# Patient Record
Sex: Male | Born: 1980 | Hispanic: Yes | Marital: Married | Smoking: Never smoker
Health system: Southern US, Community
[De-identification: ages and names within clinical notes are randomized; demographics above are authoritative.]

## PROBLEM LIST (undated history)

## (undated) DIAGNOSIS — G8929 Other chronic pain: Secondary | ICD-10-CM

## (undated) DIAGNOSIS — I10 Essential (primary) hypertension: Secondary | ICD-10-CM

## (undated) DIAGNOSIS — K219 Gastro-esophageal reflux disease without esophagitis: Secondary | ICD-10-CM

## (undated) DIAGNOSIS — T7840XA Allergy, unspecified, initial encounter: Secondary | ICD-10-CM

## (undated) HISTORY — PX: COLONOSCOPY: SHX174

## (undated) HISTORY — DX: Gastro-esophageal reflux disease without esophagitis: K21.9

## (undated) HISTORY — DX: Other chronic pain: G89.29

## (undated) HISTORY — DX: Allergy, unspecified, initial encounter: T78.40XA

---

## 2017-03-28 DIAGNOSIS — K219 Gastro-esophageal reflux disease without esophagitis: Secondary | ICD-10-CM | POA: Insufficient documentation

## 2017-09-03 DIAGNOSIS — R1084 Generalized abdominal pain: Secondary | ICD-10-CM | POA: Insufficient documentation

## 2017-09-03 DIAGNOSIS — R7989 Other specified abnormal findings of blood chemistry: Secondary | ICD-10-CM | POA: Insufficient documentation

## 2017-09-03 DIAGNOSIS — K529 Noninfective gastroenteritis and colitis, unspecified: Secondary | ICD-10-CM | POA: Insufficient documentation

## 2017-09-03 DIAGNOSIS — R5383 Other fatigue: Secondary | ICD-10-CM | POA: Insufficient documentation

## 2017-10-12 DIAGNOSIS — M545 Low back pain, unspecified: Secondary | ICD-10-CM | POA: Insufficient documentation

## 2017-10-12 DIAGNOSIS — I10 Essential (primary) hypertension: Secondary | ICD-10-CM | POA: Insufficient documentation

## 2017-10-12 DIAGNOSIS — B079 Viral wart, unspecified: Secondary | ICD-10-CM | POA: Insufficient documentation

## 2020-02-26 ENCOUNTER — Encounter (HOSPITAL_COMMUNITY): Payer: Self-pay | Admitting: Emergency Medicine

## 2020-02-26 ENCOUNTER — Observation Stay (HOSPITAL_COMMUNITY)
Admission: EM | Admit: 2020-02-26 | Discharge: 2020-02-28 | DRG: 641 | Disposition: A | Discharge status: Home / Self Care | Payer: 59 | Attending: Internal Medicine | Admitting: Internal Medicine

## 2020-02-26 ENCOUNTER — Other Ambulatory Visit: Payer: Self-pay

## 2020-02-26 DIAGNOSIS — R29898 Other symptoms and signs involving the musculoskeletal system: Secondary | ICD-10-CM

## 2020-02-26 DIAGNOSIS — G8929 Other chronic pain: Secondary | ICD-10-CM | POA: Diagnosis not present

## 2020-02-26 DIAGNOSIS — Z8249 Family history of ischemic heart disease and other diseases of the circulatory system: Secondary | ICD-10-CM

## 2020-02-26 DIAGNOSIS — D72829 Elevated white blood cell count, unspecified: Secondary | ICD-10-CM | POA: Diagnosis present

## 2020-02-26 DIAGNOSIS — R197 Diarrhea, unspecified: Secondary | ICD-10-CM | POA: Diagnosis not present

## 2020-02-26 DIAGNOSIS — R748 Abnormal levels of other serum enzymes: Secondary | ICD-10-CM | POA: Diagnosis not present

## 2020-02-26 DIAGNOSIS — E669 Obesity, unspecified: Secondary | ICD-10-CM | POA: Diagnosis not present

## 2020-02-26 DIAGNOSIS — E876 Hypokalemia: Principal | ICD-10-CM

## 2020-02-26 DIAGNOSIS — R109 Unspecified abdominal pain: Secondary | ICD-10-CM | POA: Diagnosis present

## 2020-02-26 DIAGNOSIS — Z20822 Contact with and (suspected) exposure to covid-19: Secondary | ICD-10-CM | POA: Diagnosis not present

## 2020-02-26 DIAGNOSIS — R262 Difficulty in walking, not elsewhere classified: Secondary | ICD-10-CM | POA: Diagnosis not present

## 2020-02-26 DIAGNOSIS — Z6832 Body mass index (BMI) 32.0-32.9, adult: Secondary | ICD-10-CM | POA: Diagnosis not present

## 2020-02-26 DIAGNOSIS — E860 Dehydration: Secondary | ICD-10-CM | POA: Diagnosis not present

## 2020-02-26 DIAGNOSIS — I10 Essential (primary) hypertension: Secondary | ICD-10-CM | POA: Diagnosis present

## 2020-02-26 DIAGNOSIS — M549 Dorsalgia, unspecified: Secondary | ICD-10-CM | POA: Diagnosis present

## 2020-02-26 DIAGNOSIS — M6281 Muscle weakness (generalized): Secondary | ICD-10-CM | POA: Diagnosis present

## 2020-02-26 NOTE — ED Triage Notes (Signed)
Patient reports bilateral legs weakness/numbness onset 5am this morning , denies injury , alert and oriented /respirations unlabored , patient stated that he was injected with vitamin yesterday prior to start of symptoms . Speech clear with no facial asymmetry , no arm drift .

## 2020-02-27 ENCOUNTER — Observation Stay (HOSPITAL_COMMUNITY): Payer: 59

## 2020-02-27 DIAGNOSIS — M6281 Muscle weakness (generalized): Secondary | ICD-10-CM | POA: Diagnosis present

## 2020-02-27 DIAGNOSIS — E876 Hypokalemia: Principal | ICD-10-CM

## 2020-02-27 DIAGNOSIS — E860 Dehydration: Secondary | ICD-10-CM

## 2020-02-27 DIAGNOSIS — I10 Essential (primary) hypertension: Secondary | ICD-10-CM

## 2020-02-27 DIAGNOSIS — E669 Obesity, unspecified: Secondary | ICD-10-CM

## 2020-02-27 LAB — URINALYSIS, ROUTINE W REFLEX MICROSCOPIC
Bilirubin Urine: NEGATIVE
Glucose, UA: NEGATIVE mg/dL
Hgb urine dipstick: NEGATIVE
Ketones, ur: NEGATIVE mg/dL
Leukocytes,Ua: NEGATIVE
Nitrite: NEGATIVE
Protein, ur: NEGATIVE mg/dL
Specific Gravity, Urine: 1.023 (ref 1.005–1.030)
pH: 6 (ref 5.0–8.0)

## 2020-02-27 LAB — CBC
HCT: 43.9 % (ref 39.0–52.0)
Hemoglobin: 15.7 g/dL (ref 13.0–17.0)
MCH: 31.2 pg (ref 26.0–34.0)
MCHC: 35.8 g/dL (ref 30.0–36.0)
MCV: 87.1 fL (ref 80.0–100.0)
Platelets: 215 10*3/uL (ref 150–400)
RBC: 5.04 MIL/uL (ref 4.22–5.81)
RDW: 12.9 % (ref 11.5–15.5)
WBC: 14.1 10*3/uL — ABNORMAL HIGH (ref 4.0–10.5)
nRBC: 0 % (ref 0.0–0.2)

## 2020-02-27 LAB — BASIC METABOLIC PANEL
Anion gap: 4 — ABNORMAL LOW (ref 5–15)
BUN: 19 mg/dL (ref 6–20)
CO2: 22 mmol/L (ref 22–32)
Calcium: 7.9 mg/dL — ABNORMAL LOW (ref 8.9–10.3)
Chloride: 115 mmol/L — ABNORMAL HIGH (ref 98–111)
Creatinine, Ser: 0.98 mg/dL (ref 0.61–1.24)
GFR calc Af Amer: >60 mL/min (ref >60)
GFR calc non Af Amer: >60 mL/min (ref >60)
Glucose, Bld: 119 mg/dL — ABNORMAL HIGH (ref 70–99)
Potassium: 4.5 mmol/L (ref 3.5–5.1)
Sodium: 141 mmol/L (ref 135–145)

## 2020-02-27 LAB — CBC WITH DIFFERENTIAL/PLATELET
Abs Immature Granulocytes: 0.12 10*3/uL — ABNORMAL HIGH (ref 0.00–0.07)
Basophils Absolute: 0.1 10*3/uL (ref 0.0–0.1)
Basophils Relative: 0 %
Eosinophils Absolute: 0.1 10*3/uL (ref 0.0–0.5)
Eosinophils Relative: 0 %
HCT: 49.6 % (ref 39.0–52.0)
Hemoglobin: 17.5 g/dL — ABNORMAL HIGH (ref 13.0–17.0)
Immature Granulocytes: 1 %
Lymphocytes Relative: 16 %
Lymphs Abs: 3.5 10*3/uL (ref 0.7–4.0)
MCH: 31.3 pg (ref 26.0–34.0)
MCHC: 35.3 g/dL (ref 30.0–36.0)
MCV: 88.6 fL (ref 80.0–100.0)
Monocytes Absolute: 1.1 10*3/uL — ABNORMAL HIGH (ref 0.1–1.0)
Monocytes Relative: 5 %
Neutro Abs: 16.6 10*3/uL — ABNORMAL HIGH (ref 1.7–7.7)
Neutrophils Relative %: 78 %
Platelets: 267 10*3/uL (ref 150–400)
RBC: 5.6 MIL/uL (ref 4.22–5.81)
RDW: 12.7 % (ref 11.5–15.5)
WBC: 21.5 10*3/uL — ABNORMAL HIGH (ref 4.0–10.5)
nRBC: 0 % (ref 0.0–0.2)

## 2020-02-27 LAB — MAGNESIUM: Magnesium: 1.9 mg/dL (ref 1.7–2.4)

## 2020-02-27 LAB — COMPREHENSIVE METABOLIC PANEL
ALT: 42 U/L (ref 0–44)
AST: 41 U/L (ref 15–41)
Albumin: 3.9 g/dL (ref 3.5–5.0)
Alkaline Phosphatase: 68 U/L (ref 38–126)
Anion gap: 9 (ref 5–15)
BUN: 21 mg/dL — ABNORMAL HIGH (ref 6–20)
CO2: 21 mmol/L — ABNORMAL LOW (ref 22–32)
Calcium: 9 mg/dL (ref 8.9–10.3)
Chloride: 110 mmol/L (ref 98–111)
Creatinine, Ser: 1.21 mg/dL (ref 0.61–1.24)
GFR calc Af Amer: >60 mL/min (ref >60)
GFR calc non Af Amer: >60 mL/min (ref >60)
Glucose, Bld: 152 mg/dL — ABNORMAL HIGH (ref 70–99)
Potassium: 2.5 mmol/L — CL (ref 3.5–5.1)
Sodium: 140 mmol/L (ref 135–145)
Total Bilirubin: 0.8 mg/dL (ref 0.3–1.2)
Total Protein: 7.3 g/dL (ref 6.5–8.1)

## 2020-02-27 LAB — CK
Total CK: 559 U/L — ABNORMAL HIGH (ref 49–397)
Total CK: 831 U/L — ABNORMAL HIGH (ref 49–397)

## 2020-02-27 LAB — TSH: TSH: 4.608 u[IU]/mL — ABNORMAL HIGH (ref 0.350–4.500)

## 2020-02-27 LAB — SARS CORONAVIRUS 2 (TAT 6-24 HRS): SARS Coronavirus 2: NEGATIVE

## 2020-02-27 LAB — PHOSPHORUS: Phosphorus: 1.9 mg/dL — ABNORMAL LOW (ref 2.5–4.6)

## 2020-02-27 LAB — HIV ANTIBODY (ROUTINE TESTING W REFLEX): HIV Screen 4th Generation wRfx: NONREACTIVE

## 2020-02-27 MED ORDER — POTASSIUM CHLORIDE CRYS ER 20 MEQ PO TBCR
40.0000 meq | EXTENDED_RELEASE_TABLET | Freq: Once | ORAL | Status: AC
  Administered 2020-02-27: 40 meq via ORAL
  Filled 2020-02-27: qty 2

## 2020-02-27 MED ORDER — POTASSIUM CHLORIDE 10 MEQ/100ML IV SOLN
10.0000 meq | INTRAVENOUS | Status: AC
  Administered 2020-02-27 (×3): 10 meq via INTRAVENOUS
  Filled 2020-02-27 (×3): qty 100

## 2020-02-27 MED ORDER — SODIUM CHLORIDE 0.9 % IV SOLN: INTRAVENOUS | Status: DC

## 2020-02-27 MED ORDER — ENOXAPARIN SODIUM 40 MG/0.4ML ~~LOC~~ SOLN
40.0000 mg | SUBCUTANEOUS | Status: DC
  Administered 2020-02-27 – 2020-02-28 (×2): 40 mg via SUBCUTANEOUS
  Filled 2020-02-27 (×2): qty 0.4

## 2020-02-27 NOTE — ED Notes (Signed)
Potassium 2.5, MD notified

## 2020-02-27 NOTE — ED Notes (Signed)
Pt ambulating with PT

## 2020-02-27 NOTE — ED Notes (Signed)
Pt remains in MRI, family member at bedside.

## 2020-02-27 NOTE — ED Notes (Signed)
Transported to MRI

## 2020-02-27 NOTE — Progress Notes (Addendum)
PROGRESS NOTE    William Salinas  ZSW:[id number] DOB: 19-Sep-1981 DOA: 02/26/2020 PCP: Kirstie Peri, MD   Brief Narrative:  William Salinas is a 39 y.o. male with PMH of HTN (untreated) and chronic back pain who presented to ED with muscle weakness and admitted for further workup. Found to have hypokalemia.   History provided by patient and his wife who is at bedside. Encounter conducted in Spanish by provider. Patient reports feeling well and in his usual stated of health until yesterday morning when he woke up and was unable to walk and actually fell trying to walk to the bathroom. He did not hit his head and no LOC. Reports that he felt whole body weakness and specifically in his proximal extremities. Denies anything like this happening previously. On Friday, he did receive one B12 injection into his right buttocks and reports feeling okay that night. On Saturday he did have a mild headache but nothing unlike previous headaches he has had. At baseline he as intermittent abdominal pain with occasional diarrhea and noted some diarrhea yesterday. Denies any other concerns. His chronic neck and back pain has not worsened recently. Denies trauma, fever, incontinence, unexplained weight loss, a cancer history, long-term steroid use, parenteral drug abuse, and intense localized pain. Denies dizziness, vision changes, chills, cough, SOB, chest pain, abdominal pain beyond baseline, nausea, vomiting, constipation, dysuria, hematuria, hematochezia, melena, speech difficulty, trouble eating, confusion or any other complaints. During time in ER, muscle tone already improving. Patient reports initially having trouble lifting his arms above his head and now able to do this. His wife reports that yesterday his extremities felt cold and appeared more white in color.   In the ED: Vitals stable. Labs remarkable for: Hgb 17.5, WBC 21.5, K 2.5, Mag 1.9, UA WNL, CK 559.  MRI Thoracic and Lumbar  Spine ordered and pending completion.  Patient was given potassium replacement and called for admission for further workup of acute weakness.   Assessment & Plan:   Principal Problem:   Muscle weakness (generalized) Active Problems:   Hypokalemia   HTN (hypertension)   Obesity (BMI 30-39.9)   Muscle weakness. MRI of thoracic and lumbar spine are negative for acute process CK elevated-we will add IV normal saline Recheck a CK twice daily Recheck renal function in the morning  Hypokalemia Repleted aggressively now normal at 4.5 Magnesium normal 1.9  Dehydration We will add IV hydration normal saline 100 mL/h Monitor ins and outs Recheck renal function in the morning  Leukocytosis Unclear etiology no evidence of infection Likely stress demargination recheck CBC in the morning  Hypertension: Patient with elevated blood pressure on admission,  Ranging from from 120s to 140s,  initially elevated Monitor for the next 24 hours Patient may benefit from antihypertensives on discharge  DVT prophylaxis: Lovenox SQ  Code Status: Full code    Code Status Orders  (From admission, onward)         Start     Ordered   02/27/20 0718  Full code  Continuous     02/27/20 0717        Code Status History    This patient has a current code status but no historical code status.   Advance Care Planning Activity     Family Communication: Discussed with wife at bedside Disposition Plan:   Patient be changed to inpatient as he requires IV fluid resuscitation, monitoring of renal function in setting of elevated CK and possible rhabdo. Consults called: None Admission status:  Inpatient   Consultants:   None  Procedures:  MR THORACIC SPINE WO CONTRAST  Result Date: 02/27/2020 CLINICAL DATA:  Low back pain, greater than 4 weeks or red flag, no prior imaging. EXAM: MRI THORACIC SPINE WITHOUT CONTRAST TECHNIQUE: Multiplanar, multisequence MR imaging of the thoracic spine was  performed. No intravenous contrast was administered. COMPARISON:  No pertinent prior studies available for comparison. FINDINGS: Alignment:  Normal Vertebrae: Vertebral body height is maintained. No marrow edema or suspicious osseous lesion. Cord:  No spinal cord signal abnormality. Paraspinal and other soft tissues: No abnormality identified within included portions of the thorax or upper abdomen. Paraspinal soft tissues within normal limits. Disc levels: Intervertebral disc height is preserved. There is no significant disc herniation, spinal canal stenosis or neural foraminal narrowing at any level. IMPRESSION: Unremarkable MRI of the thoracic spine. No significant disc herniation, spinal canal stenosis or neural foraminal narrowing at any level. Electronically Signed   By: Kellie Simmering DO   On: 02/27/2020 08:34   MR LUMBAR SPINE WO CONTRAST  Result Date: 02/27/2020 CLINICAL DATA:  Low back pain, greater than 4 weeks or red flag, no prior imaging. Additional history obtained from William NUMBERBilateral lower extremity weakness EXAM: MRI LUMBAR SPINE WITHOUT CONTRAST TECHNIQUE: Multiplanar, multisequence MR imaging of the lumbar spine was performed. No intravenous contrast was administered. COMPARISON:  No pertinent prior studies available for comparison. FINDINGS: Segmentation: For the purposes of this dictation, five lumbar vertebrae are assumed and the caudal most well-formed intervertebral disc is designated L5-S1. Alignment:  Normal. Vertebrae: Vertebral body height is maintained. No marrow edema or suspicious osseous lesion. Conus medullaris and cauda equina: Conus extends to the T12-L1 level. No signal abnormality within the visualized distal spinal cord. Paraspinal and other soft tissues: No abnormality identified within included portions of the abdomen/retroperitoneum. Paraspinal soft tissues within normal limits. Disc levels: No significant disc herniation, spinal canal stenosis or  neural foraminal narrowing at the L1-L2, L2-L3, L3-L4 or L4-L5 levels. At L5-S1, there is mild disc degeneration. There is a small central disc protrusion at site of posterior annular fissure. No significant spinal canal stenosis or neural foraminal narrowing. IMPRESSION: At L5-S1, there is mild disc degeneration. There is also a small central disc protrusion at site of a posterior annular fissure. No significant spinal canal stenosis or neural foraminal narrowing at this level. No significant disc herniation, spinal canal stenosis or neural foraminal narrowing at the remaining levels. Electronically Signed   By: Kellie Simmering DO   On: 02/27/2020 08:37     Antimicrobials:   None    Subjective: Patient reports feeling weak although not as bad as on admission when he fell because of weakness  Objective: Vitals:   02/27/20 0545 02/27/20 0630 02/27/20 1108 02/27/20 1240  BP: 138/77 (!) 143/73  (!) 144/88  Pulse: 80 91 91 72  Resp:    16  Temp:    98.1 F (36.7 C)  TempSrc:    Oral  SpO2: 99% 98% 93% 99%  Weight:      Height:       No intake or output data in the 24 hours ending 02/27/20 1332 Filed Weights   02/26/20 2305  Weight: 90 kg    Examination:  General exam: Appears calm and comfortable  Respiratory system: Clear to auscultation. Respiratory effort normal. Cardiovascular system: S1 & S2 heard, RRR. No JVD, murmurs, rubs, gallops or clicks. No pedal edema. Gastrointestinal system: Abdomen is nondistended, soft and nontender. No  organomegaly or masses felt. Normal bowel sounds heard. Central nervous system: Alert and oriented. No gross focal neurological deficits.  Reported weakness with exam Extremities: Warm well perfused,. Skin: Mildly dry, mild skin tenting Psychiatry: Judgement and insight appear normal. Mood & affect appropriate.     Data Reviewed: I have personally reviewed following labs and imaging studies  CBC: Recent Labs  Lab 02/26/20 2317 02/27/20 0859   WBC 21.5* 14.1*  NEUTROABS 16.6*  --   HGB 17.5* 15.7  HCT 49.6 43.9  MCV 88.6 87.1  PLT 267 215   Basic Metabolic Panel: Recent Labs  Lab 02/27/20 0122 02/27/20 0249 02/27/20 0859  NA 140  --  141  K 2.5*  --  4.5  CL 110  --  115*  CO2 21*  --  22  GLUCOSE 152*  --  119*  BUN 21*  --  19  CREATININE 1.21  --  0.98  CALCIUM 9.0  --  7.9*  MG  --  1.9  --   PHOS  --   --  1.9*   GFR: Estimated Creatinine Clearance: 109.5 mL/min (by C-G formula based on SCr of 0.98 mg/dL). Liver Function Tests: Recent Labs  Lab 02/27/20 0122  AST 41  ALT 42  ALKPHOS 68  BILITOT 0.8  PROT 7.3  ALBUMIN 3.9   No results for input(s): LIPASE, AMYLASE in the last 168 hours. No results for input(s): AMMONIA in the last 168 hours. Coagulation Profile: No results for input(s): INR, PROTIME in the last 168 hours. Cardiac Enzymes: Recent Labs  Lab 02/27/20 0357  CKTOTAL 559*   BNP (last 3 results) No results for input(s): PROBNP in the last 8760 hours. HbA1C: No results for input(s): HGBA1C in the last 72 hours. CBG: No results for input(s): GLUCAP in the last 168 hours. Lipid Profile: No results for input(s): CHOL, HDL, LDLCALC, TRIG, CHOLHDL, LDLDIRECT in the last 72 hours. Thyroid Function Tests: Recent Labs    02/27/20 0859  TSH 4.608*   Anemia Panel: No results for input(s): VITAMINB12, FOLATE, FERRITIN, TIBC, IRON, RETICCTPCT in the last 72 hours. Sepsis Labs: No results for input(s): PROCALCITON, LATICACIDVEN in the last 168 hours.  Recent Results (from the past 240 hour(s))  SARS CORONAVIRUS 2 (TAT 6-24 HRS) Nasopharyngeal Nasopharyngeal Swab     Status: None   Collection Time: 02/27/20  5:19 AM   Specimen: Nasopharyngeal Swab  Result Value Ref Range Status   SARS Coronavirus 2 NEGATIVE NEGATIVE Final    Comment: (NOTE) SARS-CoV-2 target nucleic acids are NOT DETECTED. The SARS-CoV-2 RNA is generally detectable in upper and lower respiratory specimens during  the acute phase of infection. Negative results do not preclude SARS-CoV-2 infection, do not rule out co-infections with other pathogens, and should not be used as the sole basis for treatment or other patient management decisions. Negative results must be combined with clinical observations, patient history, and epidemiological information. The expected result is Negative. Fact Sheet for Patients: HairSlick.no Fact Sheet for Healthcare Providers: quierodirigir.com This test is not yet approved or cleared by the [place] FDA and  has been authorized for detection and/or diagnosis of SARS-CoV-2 by FDA under an Emergency Use Authorization (EUA). This EUA will remain  in effect (meaning this test can be used) for the duration of the COVID-19 declaration under Section 56 4(b)(1) of the Act, 21 U.S.C. section 360bbb-3(b)(1), unless the authorization is terminated or revoked sooner. Performed at [facility], [address] [place],  [place] [address]          Radiology Studies: MR THORACIC SPINE WO CONTRAST  Result Date: 02/27/2020 CLINICAL DATA:  Low back pain, greater than 4 weeks or red flag, no prior imaging. EXAM: MRI THORACIC SPINE WITHOUT CONTRAST TECHNIQUE: Multiplanar, multisequence MR imaging of the thoracic spine was performed. No intravenous contrast was administered. COMPARISON:  No pertinent prior studies available for comparison. FINDINGS: Alignment:  Normal Vertebrae: Vertebral body height is maintained. No marrow edema or suspicious osseous lesion. Cord:  No spinal cord signal abnormality. Paraspinal and other soft tissues: No abnormality identified within included portions of the thorax or upper abdomen. Paraspinal soft tissues within normal limits. Disc levels: Intervertebral disc height is preserved. There is no significant disc herniation, spinal canal stenosis or neural foraminal narrowing at any level.  IMPRESSION: Unremarkable MRI of the thoracic spine. No significant disc herniation, spinal canal stenosis or neural foraminal narrowing at any level. Electronically Signed   By: Jackey Loge DO   On: 02/27/2020 08:34   MR LUMBAR SPINE WO CONTRAST  Result Date: 02/27/2020 CLINICAL DATA:  Low back pain, greater than 4 weeks or red flag, no prior imaging. Additional history obtained from electronic MEDICAL RECORD NUMBERBilateral lower extremity weakness EXAM: MRI LUMBAR SPINE WITHOUT CONTRAST TECHNIQUE: Multiplanar, multisequence MR imaging of the lumbar spine was performed. No intravenous contrast was administered. COMPARISON:  No pertinent prior studies available for comparison. FINDINGS: Segmentation: For the purposes of this dictation, five lumbar vertebrae are assumed and the caudal most well-formed intervertebral disc is designated L5-S1. Alignment:  Normal. Vertebrae: Vertebral body height is maintained. No marrow edema or suspicious osseous lesion. Conus medullaris and cauda equina: Conus extends to the T12-L1 level. No signal abnormality within the visualized distal spinal cord. Paraspinal and other soft tissues: No abnormality identified within included portions of the abdomen/retroperitoneum. Paraspinal soft tissues within normal limits. Disc levels: No significant disc herniation, spinal canal stenosis or neural foraminal narrowing at the L1-L2, L2-L3, L3-L4 or L4-L5 levels. At L5-S1, there is mild disc degeneration. There is a small central disc protrusion at site of posterior annular fissure. No significant spinal canal stenosis or neural foraminal narrowing. IMPRESSION: At L5-S1, there is mild disc degeneration. There is also a small central disc protrusion at site of a posterior annular fissure. No significant spinal canal stenosis or neural foraminal narrowing at this level. No significant disc herniation, spinal canal stenosis or neural foraminal narrowing at the remaining levels. Electronically Signed    By: Jackey Loge DO   On: 02/27/2020 08:37        Scheduled Meds: . enoxaparin (LOVENOX) injection  40 mg Subcutaneous Q24H   Continuous Infusions: . sodium chloride       LOS: 0 days    Time spent: 35 minutes    Burke Keels, MD [facility]  If 7PM-7AM, please contact night-coverage  02/27/2020, 1:32 PM

## 2020-02-27 NOTE — Evaluation (Signed)
Physical Therapy Evaluation Patient Details Name: William Salinas MRN: [id number] DOB: 1981/08/22 Today's Date: 02/27/2020   History of Present Illness  William Salinas is a 39 y.o. male with PMH of HTN (untreated) and chronic back pain who presented to ED with muscle weakness and admitted for further workup. Found to have hypokalemia and possible rhabdomyolysis  Clinical Impression   Pt admitted with above diagnosis. Received pt supine on stretcher, agreeing to participate; Independent prior to admission; works as a Engineer, production to PT with notable LLE weakness compared to R, initial gait stiffness, chronic lumbar and upper thoracic pain;  Walked the hallways with stiff, guarded gait and one person guarding assist; Pt currently with functional limitations due to the deficits listed below (see PT Problem List). Pt will benefit from skilled PT to increase their independence and safety with mobility to allow discharge to the venue listed below.    Strongly recommend Outpt PT for help with his chronic back pain; tells [place] he is in between PCPs (his former PCP moved, and he has not established a new PCP yet); Can he get a referral for Outpt PT from here?  Recommend script to say something like, "Outpatient PT for Back Pain: Evaluate and Treat"     Follow Up Recommendations Outpatient PT(for Back Pain)    Equipment Recommendations  None recommended by PT    Recommendations for Other Services   TOC to help establish new PCP    Precautions / Restrictions Restrictions Weight Bearing Restrictions: No      Mobility  Bed Mobility Overal bed mobility: Independent                Transfers Overall transfer level: Needs assistance   Transfers: Sit to/from Stand Sit to Stand: Min guard(without physical contact)         General transfer comment: Minguard for safety; no overt difficulty  Ambulation/Gait Ambulation/Gait assistance: Min guard Gait  Distance (Feet): 120 Feet Assistive device: None Gait Pattern/deviations: Step-through pattern;Decreased step length - right;Decreased step length - left Gait velocity: quite slow   General Gait Details: Close minguard initially for safety (especially given falls at home); slow and stiff, guarded steps initially; Able to smooth out gait pattern and loosen amount of assist with cues, and more practice/time walking; William Salinas seemed to gain confidence by the end of the walk  Stairs            Wheelchair Mobility    Modified Rankin (Stroke Patients Only)       Balance Overall balance assessment: Mild deficits observed, not formally tested                                           Pertinent Vitals/Pain Pain Assessment: Faces Faces Pain Scale: Hurts little more Pain Location: upper thoracic pain and lumbar pain; pt reports is chronic; Pain extends from lumbar spine to lateral knee with slump test Pain Descriptors / Indicators: Aching;Grimacing Pain Intervention(s): Monitored during session    Home Living Family/patient expects to be discharged to:: Private residence Living Arrangements: Spouse/significant other;Children Available Help at Discharge: Family;Available PRN/intermittently Type of Home: Apartment Home Access: Stairs to enter Entrance Stairs-Rails: None Entrance Stairs-Number of Steps: 1-2 Home Layout: One level Home Equipment: Wheelchair - manual      Prior Function Level of Independence: Independent         Comments: Forklift  driver     Hand Dominance        Extremity/Trunk Assessment   Upper Extremity Assessment Upper Extremity Assessment: Overall WFL for tasks assessed(with noted grimace due to upper thoracic pain)    Lower Extremity Assessment Lower Extremity Assessment: RLE deficits/detail;LLE deficits/detail RLE Deficits / Details: Grossly 5/5 strength throughout; notable grimace with resistence applied in hip flexion LLE  Deficits / Details: Noting weakness in L quadriceps, L hip flexion, L hamstrings -- 4-4+/5; weaker than RLE; grimace and pain with ressitence to hip flexion    Cervical / Trunk Assessment Cervical / Trunk Assessment: Other exceptions Cervical / Trunk Exceptions: upper thoracic pain and lumbar pain; pt reports is chronic; Pain extends from lumbar spine to lateral knee with slump test  Communication   Communication: Prefers language other than English(Spanish)  Cognition Arousal/Alertness: Awake/alert Behavior During Therapy: WFL for tasks assessed/performed Overall Cognitive Status: Within Functional Limits for tasks assessed                                        General Comments General comments (skin integrity, edema, etc.): William Salinas, [place] Interpreter [phone]), facilitated communication    Exercises     Assessment/Plan    PT Assessment Patient needs continued PT services  PT Problem List Decreased strength;Decreased activity tolerance;Decreased mobility;Pain       PT Treatment Interventions DME instruction;Gait training;Stair training;Functional mobility training;Therapeutic activities;Therapeutic exercise;Balance training;Patient/family education    PT Goals (Current goals can be found in the Care Plan section)  Acute Rehab PT Goals Patient Stated Goal: Would like to be home for daughter's [place] PT Goal Formulation: With patient Time For Goal Achievement: 03/12/20 Potential to Achieve Goals: Good    Frequency Min 3X/week   Barriers to discharge        Co-evaluation               AM-PAC PT "6 Clicks" Mobility  Outcome Measure Help needed turning from your back to your side while in a flat bed without using bedrails?: None Help needed moving from lying on your back to sitting on the side of a flat bed without using bedrails?: None Help needed moving to and from a bed to a chair (including a wheelchair)?: None Help needed standing up from a  chair using your arms (e.g., wheelchair or bedside chair)?: None Help needed to walk in hospital room?: None Help needed climbing 3-5 steps with a railing? : A Little 6 Click Score: 23    End of Session Equipment Utilized During Treatment: Gait belt Activity Tolerance: Patient tolerated treatment well Patient left: in chair;with call bell/phone within reach;with family/visitor present Nurse Communication: Mobility status PT Visit Diagnosis: Other abnormalities of gait and mobility (R26.89);Pain Pain - Right/Left: (Central) Pain - part of body: (Lower back and upper thoracic )    Time: 3710-6269 PT Time Calculation (min) (ACUTE ONLY): 41 min   Charges:   PT Evaluation $PT Eval Low Complexity: 1 Low PT Treatments $Gait Training: 23-37 mins        Roney [place], [place]  Acute Rehabilitation Services Pager [phone] Office [phone]   [place]ta [place] 02/27/2020, 2:04 PM

## 2020-02-27 NOTE — ED Provider Notes (Signed)
[facility] EMERGENCY DEPARTMENT Provider Note   CSN: [id number] Arrival date & time: 02/26/20  2244  History Chief Complaint  Patient presents with   Bilateral Leg Weakness    William Salinas is a 39 y.o. male.  The history is provided by the patient. A language interpreter was used.  He has history of chronic back pain and had received an injection of some B vitamins yesterday.  This morning, he woke up with pain in his thighs and was unable to stand.  There was some mild nausea but no vomiting.  He denies abdominal pain or worsening of his back pain.  He has not had any urinary difficulty.  He denies numbness or tingling.  He is not taking any medication other than vitamins on an ongoing basis.  History reviewed. No pertinent past medical history.  There are no problems to display for this patient.   History reviewed. No pertinent surgical history.     No family history on file.  Social History   Tobacco Use   Smoking status: Never Smoker   Smokeless tobacco: Never Used  Substance Use Topics   Alcohol use: Never   Drug use: Never    Home Medications Prior to Admission medications   Not on File    Allergies    Patient has no known allergies.  Review of Systems   Review of Systems  All other systems reviewed and are negative.   Physical Exam Updated Vital Signs BP (!) 142/85    Pulse 65    Temp 98.3 F (36.8 C) (Oral)    Resp 16    Ht 5\' 6"  (1.676 m)    Wt 90 kg    SpO2 99%    BMI 32.02 kg/m   Physical Exam Vitals and nursing note reviewed.   39 year old male, resting comfortably and in no acute distress. Vital signs are significant for mildly elevated blood pressure. Oxygen saturation is 99%, which is normal. Head is normocephalic and atraumatic. PERRLA, EOMI. Oropharynx is clear. Neck is nontender and supple without adenopathy or JVD. Back is nontender and there is no CVA tenderness. Lungs are clear without rales,  wheezes, or rhonchi. Chest is nontender. Heart has regular rate and rhythm without murmur. Abdomen is soft, flat, nontender without masses or hepatosplenomegaly and peristalsis is normoactive. Extremities have no cyanosis or edema, full range of motion is present.  There is very mild tenderness to palpation over the quadriceps bilaterally. Skin is warm and dry without rash. Neurologic: Mental status is normal, cranial nerves are intact.  He has marked weakness of hip flexors bilaterally with strength 2/5.  There is normal strength of his quadriceps, ankle dorsiflexion and ankle plantarflexion  ED Results / Procedures / Treatments   Labs (all labs ordered are listed, but only abnormal results are displayed) Labs Reviewed  CBC WITH DIFFERENTIAL/PLATELET - Abnormal; Notable for the following components:      Result Value   WBC 21.5 (*)    Hemoglobin 17.5 (*)    Neutro Abs 16.6 (*)    Monocytes Absolute 1.1 (*)    Abs Immature Granulocytes 0.12 (*)    All other components within normal limits  COMPREHENSIVE METABOLIC PANEL - Abnormal; Notable for the following components:   Potassium 2.5 (*)    CO2 21 (*)    Glucose, Bld 152 (*)    BUN 21 (*)    All other components within normal limits  CK - Abnormal; Notable for  the following components:   Total CK 559 (*)    All other components within normal limits  SARS CORONAVIRUS 2 (TAT 6-24 HRS)  MAGNESIUM  URINALYSIS, ROUTINE W REFLEX MICROSCOPIC  BASIC METABOLIC PANEL  PHOSPHORUS  TSH  HIV ANTIBODY (ROUTINE TESTING W REFLEX)  CBC    Radiology No results found.  Procedures Procedures  CRITICAL CARE Performed by: Delora Fuel Total critical care time: 40 minutes Critical care time was exclusive of separately billable procedures and treating other patients. Critical care was necessary to treat or prevent imminent or life-threatening deterioration. Critical care was time spent personally by me on the following activities: development  of treatment plan with patient and/or surrogate as well as nursing, discussions with consultants, evaluation of patient's response to treatment, examination of patient, obtaining history from patient or surrogate, ordering and performing treatments and interventions, ordering and review of laboratory studies, ordering and review of radiographic studies, pulse oximetry and re-evaluation of patient's condition.  Medications Ordered in ED Medications  potassium chloride 10 mEq in 100 mL IVPB (10 mEq Intravenous New Bag/Given 02/27/20 0246)  potassium chloride SA (KLOR-CON) CR tablet 40 mEq (40 mEq Oral Given 02/27/20 0243)    ED Course  I have reviewed the triage vital signs and the nursing notes.  Pertinent labs & imaging results that were available during my care of the patient were reviewed by me and considered in my medical decision making (see chart for details).  MDM Rules/Calculators/A&P Leg pain and weakness concerning for possible rhabdomyolysis.  Will check CK.  Initial labs show severe hypokalemia with potassium 2.5.  Is given oral and intravenous potassium.  WBC is elevated, nonspecific.  Hemoglobin is elevated, possible hemoconcentration versus polycythemia.  He is given IV fluids.  He has no prior records in the [facility] health system.  CK is only mildly elevated, magnesium is normal.  Review of records on care everywhere shows hemoglobin is not significantly changed from prior.  He continues to have marked weakness of hip flexors we will plan to admit to continue potassium repletion.  MRI ordered of thoracic and lumbar spine.  Case is discussed with Dr. Marice Potter of [facility], who agrees to admit the patient.  Final Clinical Impression(s) / ED Diagnoses Final diagnoses:  Leg weakness, bilateral  Hypokalemia  Elevated CK    Rx / DC Orders ED Discharge Orders    None       Delora Fuel, MD 24/23/53 [phone]

## 2020-02-27 NOTE — H&P (Signed)
[facility] History and Physical  [name] LZJ:[id number] DOB: 02/14/1981 DOA: 02/26/2020  Referring ED physician: Preston Fleeting, MD PCP: Kirstie Peri, MD   Chief Complaint: Muscle Weakness  HPI: William Salinas is a 39 y.o. male with PMH of HTN (untreated) and chronic back pain who presented to ED with muscle weakness and admitted for further workup. Found to have hypokalemia.   History provided by patient and his wife who is at bedside. Encounter conducted in Spanish by provider. Patient reports feeling well and in his usual stated of health until yesterday morning when he woke up and was unable to walk and actually fell trying to walk to the bathroom. He did not hit his head and no LOC. Reports that he felt whole body weakness and specifically in his proximal extremities. Denies anything like this happening previously. On Friday, he did receive one B12 injection into his right buttocks and reports feeling okay that night. On Saturday he did have a mild headache but nothing unlike previous headaches he has had. At baseline he as intermittent abdominal pain with occasional diarrhea and noted some diarrhea yesterday. Denies any other concerns. His chronic neck and back pain has not worsened recently. Denies trauma, fever, incontinence, unexplained weight loss, a cancer history, long-term steroid use, parenteral drug abuse, and intense localized pain. Denies dizziness, vision changes, chills, cough, SOB, chest pain, abdominal pain beyond baseline, nausea, vomiting, constipation, dysuria, hematuria, hematochezia, melena, speech difficulty, trouble eating, confusion or any other complaints. During time in ER, muscle tone already improving. Patient reports initially having trouble lifting his arms above his head and now able to do this. His wife reports that yesterday his extremities felt cold and appeared more white in color.   In the ED: Vitals stable. Labs remarkable  for: Hgb 17.5, WBC 21.5, K 2.5, Mag 1.9, UA WNL, CK 559.  MRI Thoracic and Lumbar Spine ordered and pending completion.  Patient was given potassium replacement and called for admission for further workup of acute weakness.   Review of Systems:  All other systems negative unless noted above in HPI.   History reviewed. No pertinent past medical history. History reviewed. No pertinent surgical history. Social History:  reports that he has never smoked. He has never used smokeless tobacco. He reports that he does not drink alcohol or use drugs.  No Known Allergies  Family history of DM and HTN.  Prior to Admission medications   Medication Sig Start Date End Date Taking? Authorizing Provider  Multiple Vitamin (MULTIVITAMIN WITH MINERALS) TABS tablet Take 1 tablet by mouth daily.   Yes [provider]   Physical Exam: Vitals:   02/27/20 0245 02/27/20 0300 02/27/20 0400 02/27/20 0500  BP: (!) 153/93 (!) 142/85 (!) 128/99 137/72  Pulse: 83 65 69 72  Resp:      Temp:      TempSrc:      SpO2: 97% 99% 98% 98%  Weight:      Height:        Wt Readings from Last 3 Encounters:  02/26/20 90 kg    General:  Appears calm and comfortable. AAO x4. Eyes: EOMI, normal lids, irises & conjunctiva ENT: grossly normal hearing, lips & tongue Neck: no LAD, masses or thyromegaly Cardiovascular: RRR, no m/r/g. No LE edema. Respiratory: CTA bilaterally, no w/r/r. Normal respiratory effort. Abdomen: soft, ntnd Skin: no rash or induration seen on limited exam Musculoskeletal: Grossly normal muscle tone of upper and lower extremities with intact strength on my  exam. Limited ROM of hip flexion and extension. Sensation intact to light touch of upper and lower extremities. Normal warmth and color noted.  Psychiatric: grossly normal mood and affect, speech fluent and appropriate Neurologic: CN 2-12 intact.           Labs on Admission:  Basic Metabolic Panel: Recent Labs  Lab 02/27/20 0122  02/27/20 0249  NA 140  --   K 2.5*  --   CL 110  --   CO2 21*  --   GLUCOSE 152*  --   BUN 21*  --   CREATININE 1.21  --   CALCIUM 9.0  --   MG  --  1.9   Liver Function Tests: Recent Labs  Lab 02/27/20 0122  AST 41  ALT 42  ALKPHOS 68  BILITOT 0.8  PROT 7.3  ALBUMIN 3.9   No results for input(s): LIPASE, AMYLASE in the last 168 hours. No results for input(s): AMMONIA in the last 168 hours. CBC: Recent Labs  Lab 02/26/20 2317  WBC 21.5*  NEUTROABS 16.6*  HGB 17.5*  HCT 49.6  MCV 88.6  PLT 267   Cardiac Enzymes: Recent Labs  Lab 02/27/20 0357  CKTOTAL 559*    BNP (last 3 results) No results for input(s): BNP in the last 8760 hours.  ProBNP (last 3 results) No results for input(s): PROBNP in the last 8760 hours.  CBG: No results for input(s): GLUCAP in the last 168 hours.  Radiological Exams on Admission: No results found.  EKG: None  Assessment/Plan Principal Problem:   Muscle weakness (generalized) Active Problems:   Hypokalemia   HTN (hypertension)   Obesity (BMI 30-39.9)  39 y.o. male with PMH of HTN (untreated) and chronic back pain who presented to ED with muscle weakness and admitted for further workup. Found to have hypokalemia.   Muscle Weakness - Leading differential: B12 injection causing hypokalemia which thereby caused acute muscle weakness - Symptoms already improving by the time of my examination - CK WNL - MRI Thoracic and Lumbar Spine pending  - No recent injury of back and otherwise normal Neuro exam with exception of ROM of hips - PT consulted - If not improving, consider Neuro consult  - Denies trauma, fever, incontinence, unexplained weight loss, a cancer history, long-term steroid use, parenteral drug abuse, and intense localized back pain - WBC elevation non-specific; possibly acute reaction   Hypokalemia - K 2.5 on admission - s/p 70 mEq replacement in ED; repeat BMP pending - Mag level WNL  HTN - reports he was  diagnosed with this but never started taking medications as he switched doctors - BP's normal to mild-range; f/u outpatient   Code Status: Full Code DVT Prophylaxis: Lovenox Family Communication: Wife at bedside Disposition Plan: Admit under observation. Patient currently unable to walk. May need to be transferred to inpatient level should this not resolve over next 24 hours, but expect secondary to acute hypokalemia and discharge back home in next 24 hours.   Time spent: 55 minutes  [facility] Pager [phone]

## 2020-02-28 ENCOUNTER — Encounter (HOSPITAL_COMMUNITY): Payer: Self-pay | Admitting: Internal Medicine

## 2020-02-28 DIAGNOSIS — M6281 Muscle weakness (generalized): Secondary | ICD-10-CM | POA: Diagnosis not present

## 2020-02-28 LAB — CBC
HCT: 47.8 % (ref 39.0–52.0)
Hemoglobin: 16.1 g/dL (ref 13.0–17.0)
MCH: 30.8 pg (ref 26.0–34.0)
MCHC: 33.7 g/dL (ref 30.0–36.0)
MCV: 91.4 fL (ref 80.0–100.0)
Platelets: 208 10*3/uL (ref 150–400)
RBC: 5.23 MIL/uL (ref 4.22–5.81)
RDW: 13.3 % (ref 11.5–15.5)
WBC: 11.2 10*3/uL — ABNORMAL HIGH (ref 4.0–10.5)
nRBC: 0 % (ref 0.0–0.2)

## 2020-02-28 LAB — BASIC METABOLIC PANEL
Anion gap: 8 (ref 5–15)
BUN: 13 mg/dL (ref 6–20)
CO2: 23 mmol/L (ref 22–32)
Calcium: 8.7 mg/dL — ABNORMAL LOW (ref 8.9–10.3)
Chloride: 110 mmol/L (ref 98–111)
Creatinine, Ser: 1.01 mg/dL (ref 0.61–1.24)
GFR calc Af Amer: >60 mL/min (ref >60)
GFR calc non Af Amer: >60 mL/min (ref >60)
Glucose, Bld: 116 mg/dL — ABNORMAL HIGH (ref 70–99)
Potassium: 4.1 mmol/L (ref 3.5–5.1)
Sodium: 141 mmol/L (ref 135–145)

## 2020-02-28 LAB — CK: Total CK: 571 U/L — ABNORMAL HIGH (ref 49–397)

## 2020-02-28 MED ORDER — ACETAMINOPHEN 500 MG PO TABS: 500.0000 mg | ORAL_TABLET | Freq: Four times a day (QID) | ORAL | 0 refills | Status: AC | PRN

## 2020-02-28 MED ORDER — BLOOD PRESSURE KIT: 1.0000 | PACK | Freq: Two times a day (BID) | 0 refills | Status: AC

## 2020-02-28 MED ORDER — CYCLOBENZAPRINE HCL 10 MG PO TABS: 10.0000 mg | ORAL_TABLET | Freq: Three times a day (TID) | ORAL | 0 refills | Status: DC | PRN

## 2020-02-28 NOTE — Plan of Care (Signed)
  Problem: Education: Goal: Knowledge of General Education information will improve Description: Including pain rating scale, medication(s)/side effects and non-pharmacologic comfort measures Outcome: Progressing   Problem: Clinical Measurements: Goal: Respiratory complications will improve Outcome: Progressing Note: On room air   Problem: Activity: Goal: Risk for activity intolerance will decrease Outcome: Progressing Note: Up in room and hall way independently tolerating well, wife at bedside   Problem: Nutrition: Goal: Adequate nutrition will be maintained Outcome: Progressing   Problem: Coping: Goal: Level of anxiety will decrease Outcome: Progressing   Problem: Elimination: Goal: Will not experience complications related to urinary retention Outcome: Progressing   Problem: Pain Managment: Goal: General experience of comfort will improve Outcome: Progressing Note: No complaints of pain this shift   Problem: Safety: Goal: Ability to remain free from injury will improve Outcome: Progressing   Problem: Skin Integrity: Goal: Risk for impaired skin integrity will decrease Outcome: Progressing

## 2020-02-28 NOTE — Progress Notes (Signed)
Deryck Arguelles Edsel Petrin to be D/C'd home per MD order.  Discussed prescriptions and follow up appointments with the patient and wife. Prescriptions sent to patient's preferred pharmacy, medication list explained in detail. Pt verbalized understanding.  Allergies as of 02/28/2020   No Known Allergies     Medication List    TAKE these medications   acetaminophen 500 MG tablet Commonly known as: TYLENOL Take 1 tablet (500 mg total) by mouth every 6 (six) hours as needed.   Blood Pressure Kit 1 each by Does not apply route in the morning and at bedtime.   cyclobenzaprine 10 MG tablet Commonly known as: FLEXERIL Take 1 tablet (10 mg total) by mouth 3 (three) times daily as needed for muscle spasms.   multivitamin with minerals Tabs tablet Take 1 tablet by mouth daily.       Vitals:   02/27/20 2118 02/28/20 0844  BP: 137/74 (!) 153/85  Pulse: 69 79  Resp: 18 17  Temp: 98.1 F (36.7 C) 97.9 F (36.6 C)  SpO2: 97% 98%    Skin clean, dry and intact without evidence of skin break down, no evidence of skin tears noted. IV catheter discontinued intact. Site without signs and symptoms of complications. Dressing and pressure applied. Pt denies pain at this time. No complaints noted. Doctor's note given to patient for work.  An After Visit Summary was printed and given to the patient. Patient escorted via [place], and D/C home via private auto with wife.  [place] 02/28/2020 11:55 AM

## 2020-02-28 NOTE — Discharge Summary (Addendum)
Physician Discharge Summary  William Salinas ONG:[id number] DOB: 05-10-1981 DOA: 02/26/2020  PCP: Oran Rein, MD  Admit date: 02/26/2020 Discharge date: 02/28/2020  Admitted From: Home Disposition:  Home  Discharge Condition:Stable CODE STATUS:FULL Diet recommendation:  Regular   Brief/Interim Summary: HPI : William Arguelles Martinezis a 39 y.o.malewith PMH of HTN (untreated) and chronic back pain who presented to ED with muscle weakness and admitted for further workup. Found to have hypokalemia.   History provided by patient and his wife who is at bedside. Encounter conducted in Spanish by provider. Patient reports feeling well and in his usual stated of health until yesterday morning when he woke up and was unable to walk and actually fell trying to walk to the bathroom. He did not hit his head and no LOC. Reports that he felt whole body weakness and specifically in his proximal extremities. Denies anything like this happening previously. On Friday, he did receive one B12 injection into his right buttocks and reports feeling okay that night. On Saturday he did have a mild headache but nothing unlike previous headaches he has had. At baseline he as intermittent abdominal pain with occasional diarrhea and noted some diarrhea yesterday. Denies any other concerns. His chronic neck and back pain has not worsened recently. Deniestrauma, fever, incontinence, unexplained weight loss, a cancer history, long-term steroid use, parenteral drug abuse, and intense localized pain.Deniesdizziness, vision changes, chills, cough, SOB, chest pain, abdominal painbeyond baseline, nausea, vomiting, constipation, dysuria, hematuria, hematochezia, melena, speech difficulty, trouble eating, confusion or any other complaints.During time in ER, muscle tone already improving. Patient reports initially having trouble lifting his arms above his head and now able to do this. His wife reports that  yesterday his extremities felt cold and appeared more white in color.   In the ED: Vitals stable. Labs remarkable for: Hgb 17.5, WBC 21.5, K 2.5, Mag 1.9, UA WNL, CK 559.  MRI Thoracic and Lumbar Spine ordered and pending completion.  Patient was given potassium replacement and called for admission for further workup of acute weakness.  Hospital Course: His hospital course remained stable.  Blood pressure improved during the hospitalization.  He was treated with IV fluids.  Hypokalemia was treated with supplementation of potassium.  This morning he remains hemodynamically stable.  MRI did not show any significant abnormalities. He is stable for discharge to home today.  Physical therapy recommended outpatient PT.  Will recommend him to follow-up with the PCP in a week.  We will also recommend to follow-up with outpatient pain management clinic.  Following problems were addressed during his hospitalization:  Muscle weakness. MRI of thoracic and lumbar spine are negative for acute process CK mildly  elevated Kidney function normal  Hypokalemia Supplemented and corrected  Dehydration Treated with IV fluids  Leukocytosis Resolved  Hypertension: Patient with elevated blood pressure on admission,  Ranging from from 120s to 140s,  initially elevated This morning blood pressure was normal.  Will recommend to monitor blood pressure at home and follow-up with PCP.  Will not start on any antihypertensives for now.  Discharge Diagnoses:  Principal Problem:   Muscle weakness (generalized) Active Problems:   Hypokalemia   HTN (hypertension)   Obesity (BMI 30-39.9)   Dehydration    Discharge Instructions  Discharge Instructions    Diet general   Complete by: As directed    Discharge instructions   Complete by: As directed    1)Please follow up with your PCP in a week. 2)Monitor your blood pressure. 3)Follow up  with pain management clinic as an outpatient   Increase activity  slowly   Complete by: As directed      Allergies as of 02/28/2020   No Known Allergies     Medication List    TAKE these medications   acetaminophen 500 MG tablet Commonly known as: TYLENOL Take 1 tablet (500 mg total) by mouth every 6 (six) hours as needed.   Blood Pressure Kit 1 each by Does not apply route in the morning and at bedtime.   cyclobenzaprine 10 MG tablet Commonly known as: FLEXERIL Take 1 tablet (10 mg total) by mouth 3 (three) times daily as needed for muscle spasms.   multivitamin with minerals Tabs tablet Take 1 tablet by mouth daily.      Follow-up Information    Oran Rein, MD. Schedule an appointment as soon as possible for a visit in 1 week(s).   Specialty: Internal Medicine Contact information: [address] [place] [phone]          No Known Allergies  Consultations:  None   Procedures/Studies: MR THORACIC SPINE WO CONTRAST  Result Date: 02/27/2020 CLINICAL DATA:  Low back pain, greater than 4 weeks or red flag, no prior imaging. EXAM: MRI THORACIC SPINE WITHOUT CONTRAST TECHNIQUE: Multiplanar, multisequence MR imaging of the thoracic spine was performed. No intravenous contrast was administered. COMPARISON:  No pertinent prior studies available for comparison. FINDINGS: Alignment:  Normal Vertebrae: Vertebral body height is maintained. No marrow edema or suspicious osseous lesion. Cord:  No spinal cord signal abnormality. Paraspinal and other soft tissues: No abnormality identified within included portions of the thorax or upper abdomen. Paraspinal soft tissues within normal limits. Disc levels: Intervertebral disc height is preserved. There is no significant disc herniation, spinal canal stenosis or neural foraminal narrowing at any level. IMPRESSION: Unremarkable MRI of the thoracic spine. No significant disc herniation, spinal canal stenosis or neural foraminal narrowing at any level. Electronically Signed   By:  Kellie Simmering DO   On: 02/27/2020 08:34   MR LUMBAR SPINE WO CONTRAST  Result Date: 02/27/2020 CLINICAL DATA:  Low back pain, greater than 4 weeks or red flag, no prior imaging. Additional history obtained from [place] NUMBERBilateral lower extremity weakness EXAM: MRI LUMBAR SPINE WITHOUT CONTRAST TECHNIQUE: Multiplanar, multisequence MR imaging of the lumbar spine was performed. No intravenous contrast was administered. COMPARISON:  No pertinent prior studies available for comparison. FINDINGS: Segmentation: For the purposes of this dictation, five lumbar vertebrae are assumed and the caudal most well-formed intervertebral disc is designated L5-S1. Alignment:  Normal. Vertebrae: Vertebral body height is maintained. No marrow edema or suspicious osseous lesion. Conus medullaris and cauda equina: Conus extends to the T12-L1 level. No signal abnormality within the visualized distal spinal cord. Paraspinal and other soft tissues: No abnormality identified within included portions of the abdomen/retroperitoneum. Paraspinal soft tissues within normal limits. Disc levels: No significant disc herniation, spinal canal stenosis or neural foraminal narrowing at the L1-L2, L2-L3, L3-L4 or L4-L5 levels. At L5-S1, there is mild disc degeneration. There is a small central disc protrusion at site of posterior annular fissure. No significant spinal canal stenosis or neural foraminal narrowing. IMPRESSION: At L5-S1, there is mild disc degeneration. There is also a small central disc protrusion at site of a posterior annular fissure. No significant spinal canal stenosis or neural foraminal narrowing at this level. No significant disc herniation, spinal canal stenosis or neural foraminal narrowing at the remaining levels. Electronically Signed  By: Kellie Simmering DO   On: 02/27/2020 08:37      Subjective: Patient seen and examined at the bedside this morning.  Hemodynamically stable for discharge  today.  Discharge Exam: Vitals:   02/27/20 2118 02/28/20 0844  BP: 137/74 (!) 153/85  Pulse: 69 79  Resp: 18 17  Temp: 98.1 F (36.7 C) 97.9 F (36.6 C)  SpO2: 97% 98%   Vitals:   02/27/20 1240 02/27/20 1829 02/27/20 2118 02/28/20 0844  BP: (!) 144/88 134/79 137/74 (!) 153/85  Pulse: 72 84 69 79  Resp: _0 Temp: 98.1 F (36.7 C) 99.3 F (37.4 C) 98.1 F (36.7 C) 97.9 F (36.6 C)  TempSrc: Oral Oral Oral Oral  SpO2: 99% 96% 97% 98%  Weight:      Height:        General: Pt is alert, awake, not in acute distress Cardiovascular: RRR, S1/S2 +, no rubs, no gallops Respiratory: CTA bilaterally, no wheezing, no rhonchi Abdominal: Soft, NT, ND, bowel sounds + Extremities: no edema, no cyanosis    The results of significant diagnostics from this hospitalization (including imaging, microbiology, ancillary and laboratory) are listed below for reference.     Microbiology: Recent Results (from the past 240 hour(s))  SARS CORONAVIRUS 2 (TAT 6-24 HRS) Nasopharyngeal Nasopharyngeal Swab     Status: None   Collection Time: 02/27/20  5:19 AM   Specimen: Nasopharyngeal Swab  Result Value Ref Range Status   SARS Coronavirus 2 NEGATIVE NEGATIVE Final    Comment: (NOTE) SARS-CoV-2 target nucleic acids are NOT DETECTED. The SARS-CoV-2 RNA is generally detectable in upper and lower respiratory specimens during the acute phase of infection. Negative results do not preclude SARS-CoV-2 infection, do not rule out co-infections with other pathogens, and should not be used as the sole basis for treatment or other patient management decisions. Negative results must be combined with clinical observations, patient history, and epidemiological information. The expected result is Negative. Fact Sheet for Patients: SugarRoll.be Fact Sheet for Healthcare Providers: https://www.woods-mathews.com/ This test is not yet approved or cleared by the  [place] FDA and  has been authorized for detection and/or diagnosis of SARS-CoV-2 by FDA under an Emergency Use Authorization (EUA). This EUA will remain  in effect (meaning this test can be used) for the duration of the COVID-19 declaration under Section 56 4(b)(1) of the Act, 21 U.S.C. section 360bbb-3(b)(1), unless the authorization is terminated or revoked sooner. Performed at [facility], [place] [address] [place], [place] [address]      Labs: BNP (last 3 results) No results for input(s): BNP in the last 8760 hours. Basic Metabolic Panel: Recent Labs  Lab 02/27/20 0122 02/27/20 0249 02/27/20 0859 02/28/20 0128  NA 140  --  141 141  K 2.5*  --  4.5 4.1  CL 110  --  115* 110  CO2 21*  --  22 23  GLUCOSE 152*  --  119* 116*  BUN 21*  --  19 13  CREATININE 1.21  --  0.98 1.01  CALCIUM 9.0  --  7.9* 8.7*  MG  --  1.9  --   --   PHOS  --   --  1.9*  --    Liver Function Tests: Recent Labs  Lab 02/27/20 0122  AST 41  ALT 42  ALKPHOS 68  BILITOT 0.8  PROT 7.3  ALBUMIN 3.9   No results for input(s): LIPASE, AMYLASE in the last 168 hours.  No results for input(s): AMMONIA in the last 168 hours. CBC: Recent Labs  Lab 02/26/20 2317 02/27/20 0859 02/28/20 0128  WBC 21.5* 14.1* 11.2*  NEUTROABS 16.6*  --   --   HGB 17.5* 15.7 16.1  HCT 49.6 43.9 47.8  MCV 88.6 87.1 91.4  PLT 267 215 208   Cardiac Enzymes: Recent Labs  Lab 02/27/20 0357 02/27/20 1837 02/28/20 0704  CKTOTAL 559* 831* 571*   BNP: Invalid input(s): POCBNP CBG: No results for input(s): GLUCAP in the last 168 hours. D-Dimer No results for input(s): DDIMER in the last 72 hours. Hgb A1c No results for input(s): HGBA1C in the last 72 hours. Lipid Profile No results for input(s): CHOL, HDL, LDLCALC, TRIG, CHOLHDL, LDLDIRECT in the last 72 hours. Thyroid function studies Recent Labs    02/27/20 0859  TSH 4.608*   Anemia work up No results for input(s): VITAMINB12, FOLATE,  FERRITIN, TIBC, IRON, RETICCTPCT in the last 72 hours. Urinalysis    Component Value Date/Time   COLORURINE YELLOW 02/27/2020 0346   APPEARANCEUR CLEAR 02/27/2020 0346   LABSPEC 1.023 02/27/2020 0346   PHURINE 6.0 02/27/2020 0346   GLUCOSEU NEGATIVE 02/27/2020 0346   HGBUR NEGATIVE 02/27/2020 0346   BILIRUBINUR NEGATIVE 02/27/2020 0346   KETONESUR NEGATIVE 02/27/2020 0346   PROTEINUR NEGATIVE 02/27/2020 0346   NITRITE NEGATIVE 02/27/2020 0346   LEUKOCYTESUR NEGATIVE 02/27/2020 0346   Sepsis Labs Invalid input(s): PROCALCITONIN,  WBC,  LACTICIDVEN Microbiology Recent Results (from the past 240 hour(s))  SARS CORONAVIRUS 2 (TAT 6-24 HRS) Nasopharyngeal Nasopharyngeal Swab     Status: None   Collection Time: 02/27/20  5:19 AM   Specimen: Nasopharyngeal Swab  Result Value Ref Range Status   SARS Coronavirus 2 NEGATIVE NEGATIVE Final    Comment: (NOTE) SARS-CoV-2 target nucleic acids are NOT DETECTED. The SARS-CoV-2 RNA is generally detectable in upper and lower respiratory specimens during the acute phase of infection. Negative results do not preclude SARS-CoV-2 infection, do not rule out co-infections with other pathogens, and should not be used as the sole basis for treatment or other patient management decisions. Negative results must be combined with clinical observations, patient history, and epidemiological information. The expected result is Negative. Fact Sheet for Patients: SugarRoll.be Fact Sheet for Healthcare Providers: https://www.woods-mathews.com/ This test is not yet approved or cleared by the [place] FDA and  has been authorized for detection and/or diagnosis of SARS-CoV-2 by FDA under an Emergency Use Authorization (EUA). This EUA will remain  in effect (meaning this test can be used) for the duration of the COVID-19 declaration under Section 56 4(b)(1) of the Act, 21 U.S.C. section 360bbb-3(b)(1), unless the  authorization is terminated or revoked sooner. Performed at [facility], [place] [address] [place], [place] [address]     Please note: You were cared for by a hospitalist during your hospital stay. Once you are discharged, your primary care physician will handle any further medical issues. Please note that NO REFILLS for any discharge medications will be authorized once you are discharged, as it is imperative that you return to your primary care physician (or establish a relationship with a primary care physician if you do not have one) for your post hospital discharge needs so that they can reassess your need for medications and monitor your lab values.    Time coordinating discharge: 40 minutes  SIGNED:   Shelly Coss, MD  [facility] 02/28/2020, 9:04 AM Pager [phone]  If 7PM-7AM, please contact night-coverage [email or web address] Password  [email or web address]

## 2020-02-28 NOTE — Progress Notes (Signed)
Physical Therapy Treatment Patient Details Name: William Salinas MRN: [id number] DOB: 03/27/1981 Today's Date: 02/28/2020    History of Present Illness William Salinas William Salinas is a 39 y.o. male with PMH of HTN (untreated) and chronic back pain who presented to ED with muscle weakness and admitted for further workup. Found to have hypokalemia and possible rhabdomyolysis    PT Comments    Pt with improved gait, functional strength, and tolerance for activity today. Pt ambulated great hallway distance with supervision and no AD. Pt with chronic postural issues, including forward head and rounded shoulders. PT educated pt and wife on HEP to address these until able to go to OPPT. Pt to Salinas/c home today, mobilizing very well at this time.    Follow Up Recommendations  Outpatient PT(for Back Pain)     Equipment Recommendations  None recommended by PT    Recommendations for Other Services       Precautions / Restrictions Precautions Precautions: Fall Restrictions Weight Bearing Restrictions: No    Mobility  Bed Mobility Overal bed mobility: Modified Independent             General bed mobility comments: increased time, use of log roll technique with bedrails for comfort.  Transfers Overall transfer level: Needs assistance   Transfers: Sit to/from Stand Sit to Stand: Supervision(without physical contact)         General transfer comment: supervision for safety, increased time to rise.  Ambulation/Gait Ambulation/Gait assistance: Supervision Gait Distance (Feet): 300 Feet Assistive device: None Gait Pattern/deviations: Step-through pattern;Decreased stride length;Trunk flexed Gait velocity: decr   General Gait Details: Supervision for safety, slow and steady gait with no functional weakness noted. Pt with chronic forward head, rounded shoulders posture; verbal cuing for relaxing shoulders during gait x2.   Stairs             Wheelchair  Mobility    Modified Rankin (Stroke Patients Only)       Balance Overall balance assessment: Mild deficits observed, not formally tested                                          Cognition Arousal/Alertness: Awake/alert Behavior During Therapy: WFL for tasks assessed/performed Overall Cognitive Status: Within Functional Limits for tasks assessed                                 General Comments: Pt primarily speaks spanish, but does speak and understand english. Wife present during session, states she will translate as needed      Exercises Other Exercises Other Exercises: HEP - chin tucks in sitting or supine for improved cervical alignment and cervical extensor stretching, instructed pt's wife in gentle STM to bilateral upper traps R>L    General Comments        Pertinent Vitals/Pain Pain Assessment: Faces Faces Pain Scale: Hurts a little bit Pain Location: neck and upper thoracic Pain Descriptors / Indicators: Aching;Grimacing Pain Intervention(s): Limited activity within patient's tolerance;Monitored during session;Repositioned    Home Living Family/patient expects to be discharged to:: Private residence Living Arrangements: Spouse/significant other;Children                  Prior Function            PT Goals (current goals can now be found in the care plan  section) Acute Rehab PT Goals Patient Stated Goal: Would like to be home for daughter's [place] PT Goal Formulation: With patient Time For Goal Achievement: 03/12/20 Potential to Achieve Goals: Good Progress towards PT goals: Progressing toward goals    Frequency    Min 3X/week      PT Plan Current plan remains appropriate    Co-evaluation              AM-PAC PT "6 Clicks" Mobility   Outcome Measure  Help needed turning from your back to your side while in a flat bed without using bedrails?: None Help needed moving from lying on your back to  sitting on the side of a flat bed without using bedrails?: None Help needed moving to and from a bed to a chair (including a wheelchair)?: None Help needed standing up from a chair using your arms (e.g., wheelchair or bedside chair)?: None Help needed to walk in hospital room?: None Help needed climbing 3-5 steps with a railing? : None 6 Click Score: 24    End of Session   Activity Tolerance: Patient tolerated treatment well Patient left: in chair;with call bell/phone within reach;with family/visitor present Nurse Communication: Mobility status PT Visit Diagnosis: Other abnormalities of gait and mobility (R26.89);Pain Pain - Right/Left: (Central) Pain - part of body: (Lower back and upper thoracic )     Time: 9604-5409 PT Time Calculation (min) (ACUTE ONLY): 17 min  Charges:  $Gait Training: 8-22 mins                    [place] [address], [place] Pager [phone]  Office [phone]    William Salinas [name] 02/28/2020, 10:17 AM

## 2020-02-28 NOTE — TOC Initial Note (Signed)
Transition of Care [facility]) - Initial/Assessment Note    Patient Details  Name: William Salinas MRN: [id number] Date of Birth: 1981-09-24  [facility]) CM/SW Contact:    Mearl Latin, LCSW Phone Number: 02/28/2020, 10:18 AM  Clinical Narrative:                 CSW received consult regarding PCP need and outpatient PT referral. CSW spoke with patient using Spanish speaking interpretor [facility]). Patient reported agreement with outpatient therapy referral. [facility]erral sent to [facility] on [facility]. CSW also made hospital follow up appointment at [facility] and [facility]. Patient denied other needs at this time. His wife is present at bedside to transport him.   Expected Discharge Plan: OP Rehab Barriers to Discharge: No Barriers Identified   Patient Goals and CMS Choice Patient states their goals for this hospitalization and ongoing recovery are:: Return home CMS Medicare.gov Compare Post Acute Care list provided to:: Patient Choice offered to / list presented to : Patient  Expected Discharge Plan and Services Expected Discharge Plan: OP Rehab In-house Referral: PCP / [facility] Discharge Planning Services: CM Consult Post Acute Care Choice: (Outpatient rehab) Living arrangements for the past 2 months: Apartment Expected Discharge Date: 02/28/20               DME Arranged: N/A DME Agency: NA       HH Arranged: NA          Prior Living Arrangements/Services Living arrangements for the past 2 months: Apartment Lives with:: Spouse Patient language and need for interpreter reviewed:: Yes(Spanish Speaking) Do you feel safe going back to the place where you live?: Yes      Need for Family Participation in Patient Care: No (Comment) Care giver support system in place?: Yes (comment)   Criminal Activity/Legal Involvement Pertinent to Current Situation/Hospitalization: No - Comment as needed  Activities of Daily Living Home  Assistive Devices/Equipment: None ADL Screening (condition at time of admission) Patient's cognitive ability adequate to safely complete daily activities?: Yes Is the patient deaf or have difficulty hearing?: No Does the patient have difficulty seeing, even when wearing glasses/contacts?: No Does the patient have difficulty concentrating, remembering, or making decisions?: No Patient able to express need for assistance with ADLs?: Yes Does the patient have difficulty dressing or bathing?: No Independently performs ADLs?: Yes (appropriate for developmental age) Does the patient have difficulty walking or climbing stairs?: No Weakness of Legs: None Weakness of Arms/Hands: None  Permission Sought/Granted Permission sought to share information with : Facility Industrial/product designer granted to share information with : Yes, Verbal Permission Granted     Permission granted to share info w AGENCY: PCP/Outpatient PT        Emotional Assessment Appearance:: Appears stated age Attitude/Demeanor/Rapport: Gracious Affect (typically observed): Accepting, Appropriate Orientation: : Oriented to Self, Oriented to Place, Oriented to  Time, Oriented to Situation Alcohol / Substance Use: Not Applicable Psych Involvement: No (comment)  Admission diagnosis:  Muscle weakness (generalized) [M62.81] Hypokalemia [E87.6] Elevated CK [R74.8] Leg weakness, bilateral [R29.898] Dehydration [E86.0] Patient Active Problem List   Diagnosis Date Noted  . Muscle weakness (generalized) 02/27/2020  . Hypokalemia 02/27/2020  . HTN (hypertension) 02/27/2020  . Obesity (BMI 30-39.9) 02/27/2020  . Dehydration 02/27/2020   PCP:  Kirstie Peri, MD Pharmacy:   [facility] [id number] - [place], [place] - [address]   [place] [address] Phone: [phone] Fax: [phone]     Social Determinants of Health (SDOH) Interventions     Readmission Risk Interventions No flowsheet data found.

## 2020-03-09 ENCOUNTER — Ambulatory Visit: Payer: 59

## 2020-03-31 DIAGNOSIS — N5089 Other specified disorders of the male genital organs: Secondary | ICD-10-CM | POA: Insufficient documentation

## 2020-04-10 DIAGNOSIS — E782 Mixed hyperlipidemia: Secondary | ICD-10-CM | POA: Insufficient documentation

## 2020-04-17 DIAGNOSIS — N503 Cyst of epididymis: Secondary | ICD-10-CM | POA: Insufficient documentation

## 2021-01-16 DIAGNOSIS — K429 Umbilical hernia without obstruction or gangrene: Secondary | ICD-10-CM | POA: Insufficient documentation

## 2022-04-24 ENCOUNTER — Encounter (HOSPITAL_COMMUNITY): Payer: Self-pay | Admitting: Student

## 2022-04-24 ENCOUNTER — Emergency Department (HOSPITAL_COMMUNITY)
Admission: EM | Admit: 2022-04-24 | Discharge: 2022-04-24 | Disposition: A | Discharge status: Home / Self Care | Payer: 59 | Attending: Emergency Medicine | Admitting: Emergency Medicine

## 2022-04-24 ENCOUNTER — Emergency Department (HOSPITAL_COMMUNITY): Payer: 59

## 2022-04-24 ENCOUNTER — Other Ambulatory Visit: Payer: Self-pay

## 2022-04-24 DIAGNOSIS — J4541 Moderate persistent asthma with (acute) exacerbation: Secondary | ICD-10-CM | POA: Insufficient documentation

## 2022-04-24 DIAGNOSIS — R112 Nausea with vomiting, unspecified: Secondary | ICD-10-CM

## 2022-04-24 DIAGNOSIS — J45901 Unspecified asthma with (acute) exacerbation: Secondary | ICD-10-CM

## 2022-04-24 DIAGNOSIS — D72829 Elevated white blood cell count, unspecified: Secondary | ICD-10-CM | POA: Diagnosis not present

## 2022-04-24 DIAGNOSIS — E876 Hypokalemia: Secondary | ICD-10-CM | POA: Diagnosis not present

## 2022-04-24 DIAGNOSIS — E860 Dehydration: Secondary | ICD-10-CM | POA: Insufficient documentation

## 2022-04-24 DIAGNOSIS — N179 Acute kidney failure, unspecified: Secondary | ICD-10-CM | POA: Insufficient documentation

## 2022-04-24 DIAGNOSIS — R0602 Shortness of breath: Secondary | ICD-10-CM | POA: Diagnosis present

## 2022-04-24 DIAGNOSIS — Z20822 Contact with and (suspected) exposure to covid-19: Secondary | ICD-10-CM | POA: Diagnosis not present

## 2022-04-24 HISTORY — DX: Essential (primary) hypertension: I10

## 2022-04-24 LAB — COMPREHENSIVE METABOLIC PANEL
ALT: 34 U/L (ref 0–44)
AST: 27 U/L (ref 15–41)
Albumin: 4.7 g/dL (ref 3.5–5.0)
Alkaline Phosphatase: 62 U/L (ref 38–126)
Anion gap: 11 (ref 5–15)
BUN: 28 mg/dL — ABNORMAL HIGH (ref 6–20)
CO2: 19 mmol/L — ABNORMAL LOW (ref 22–32)
Calcium: 9.8 mg/dL (ref 8.9–10.3)
Chloride: 107 mmol/L (ref 98–111)
Creatinine, Ser: 1.33 mg/dL — ABNORMAL HIGH (ref 0.61–1.24)
GFR, Estimated: >60 mL/min (ref >60)
Glucose, Bld: 161 mg/dL — ABNORMAL HIGH (ref 70–99)
Potassium: 3.3 mmol/L — ABNORMAL LOW (ref 3.5–5.1)
Sodium: 137 mmol/L (ref 135–145)
Total Bilirubin: 0.9 mg/dL (ref 0.3–1.2)
Total Protein: 8.4 g/dL — ABNORMAL HIGH (ref 6.5–8.1)

## 2022-04-24 LAB — URINALYSIS, ROUTINE W REFLEX MICROSCOPIC
Bilirubin Urine: NEGATIVE
Glucose, UA: NEGATIVE mg/dL
Hgb urine dipstick: NEGATIVE
Ketones, ur: 5 mg/dL — AB
Leukocytes,Ua: NEGATIVE
Nitrite: NEGATIVE
Protein, ur: NEGATIVE mg/dL
Specific Gravity, Urine: 1.028 (ref 1.005–1.030)
pH: 5 (ref 5.0–8.0)

## 2022-04-24 LAB — RESP PANEL BY RT-PCR (FLU A&B, COVID) ARPGX2
Influenza A by PCR: NEGATIVE
Influenza B by PCR: NEGATIVE
SARS Coronavirus 2 by RT PCR: NEGATIVE

## 2022-04-24 LAB — CBC WITH DIFFERENTIAL/PLATELET
Abs Immature Granulocytes: 0.09 10*3/uL — ABNORMAL HIGH (ref 0.00–0.07)
Basophils Absolute: 0 10*3/uL (ref 0.0–0.1)
Basophils Relative: 0 %
Eosinophils Absolute: 0.2 10*3/uL (ref 0.0–0.5)
Eosinophils Relative: 1 %
HCT: 54.2 % — ABNORMAL HIGH (ref 39.0–52.0)
Hemoglobin: 18.9 g/dL — ABNORMAL HIGH (ref 13.0–17.0)
Immature Granulocytes: 1 %
Lymphocytes Relative: 7 %
Lymphs Abs: 1.4 10*3/uL (ref 0.7–4.0)
MCH: 31.3 pg (ref 26.0–34.0)
MCHC: 34.9 g/dL (ref 30.0–36.0)
MCV: 89.7 fL (ref 80.0–100.0)
Monocytes Absolute: 1 10*3/uL (ref 0.1–1.0)
Monocytes Relative: 5 %
Neutro Abs: 17.1 10*3/uL — ABNORMAL HIGH (ref 1.7–7.7)
Neutrophils Relative %: 86 %
Platelets: 242 10*3/uL (ref 150–400)
RBC: 6.04 MIL/uL — ABNORMAL HIGH (ref 4.22–5.81)
RDW: 12.5 % (ref 11.5–15.5)
WBC: 19.9 10*3/uL — ABNORMAL HIGH (ref 4.0–10.5)
nRBC: 0 % (ref 0.0–0.2)

## 2022-04-24 LAB — MAGNESIUM: Magnesium: 2.2 mg/dL (ref 1.7–2.4)

## 2022-04-24 LAB — CK: Total CK: 189 U/L (ref 49–397)

## 2022-04-24 LAB — LIPASE, BLOOD: Lipase: 28 U/L (ref 11–51)

## 2022-04-24 MED ORDER — PREDNISONE 20 MG PO TABS: 40.0000 mg | ORAL_TABLET | Freq: Every day | ORAL | 0 refills | Status: DC

## 2022-04-24 MED ORDER — ONDANSETRON 4 MG PO TBDP
4.0000 mg | ORAL_TABLET | Freq: Once | ORAL | Status: AC
Start: 2022-04-24 — End: 2022-04-24
  Administered 2022-04-24: 4 mg via ORAL
  Filled 2022-04-24: qty 1

## 2022-04-24 MED ORDER — POTASSIUM CHLORIDE CRYS ER 20 MEQ PO TBCR
40.0000 meq | EXTENDED_RELEASE_TABLET | Freq: Once | ORAL | Status: AC
Start: 2022-04-24 — End: 2022-04-24
  Administered 2022-04-24: 40 meq via ORAL
  Filled 2022-04-24: qty 2

## 2022-04-24 MED ORDER — LACTATED RINGERS IV BOLUS
1000.0000 mL | Freq: Once | INTRAVENOUS | Status: AC
  Administered 2022-04-24: 1000 mL via INTRAVENOUS

## 2022-04-24 MED ORDER — ALBUTEROL SULFATE HFA 108 (90 BASE) MCG/ACT IN AERS
2.0000 | INHALATION_SPRAY | Freq: Once | RESPIRATORY_TRACT | Status: AC
Start: 2022-04-24 — End: 2022-04-24
  Administered 2022-04-24: 2 via RESPIRATORY_TRACT
  Filled 2022-04-24: qty 6.7

## 2022-04-24 MED ORDER — ACETAMINOPHEN 325 MG PO TABS
650.0000 mg | ORAL_TABLET | Freq: Four times a day (QID) | ORAL | Status: DC | PRN
  Administered 2022-04-24: 650 mg via ORAL
  Filled 2022-04-24: qty 2

## 2022-04-24 MED ORDER — ONDANSETRON HCL 4 MG PO TABS: 4.0000 mg | ORAL_TABLET | Freq: Four times a day (QID) | ORAL | 0 refills | Status: DC

## 2022-04-24 NOTE — ED Provider Notes (Signed)
?[facility] ?Provider Note ? ? ?CSN: [id number] ?Arrival date & time: 04/24/22  0153 ? ?  ? ?History ? ?Chief Complaint  ?Patient presents with  ? Shortness of Breath  ? ? ?William Salinas is a 41 y.o. male. ? ?Patient is a 41 year old male with a history of asthma and prior hypokalemia who is presenting today with 2 separate complaints.  Patient's wife gives the majority of the history but reports that for the last few months he has had a lot of coughing and wheezing most notably at night.  She reports every year he struggles with allergies and this time of year is always the worst but this year has been particularly bad.  He does not have an inhaler that he uses at home but had received one 3 years ago when he saw Dr.  Last night the wheezing became worse and he was feeling very short of breath and coughing and sneezing.  However then he developed nausea vomiting and diarrhea.  They think it may be from the soup he ate that had sat out for a long period of time and was in the car.  Nobody else ate the soup and everyone else at home feels fine.  He does not have any abdominal pain and reports he still feels a little bit nauseated but he did receive antiemetic when he arrived to the emergency room.  He had no blood in his stool or emesis.  He denies any recent travel or antibiotic use.  He denies any urinary complaints. ? ?The history is provided by the spouse and the patient. The history is limited by a language barrier (A Spanish interpreter was offered but they declined and preferred to conduct the interview in [place]).  ?Shortness of Breath ? ?  ? ?Home Medications ?Prior to Admission medications   ?Medication Sig Start Date End Date Taking? Authorizing Provider  ?ondansetron (ZOFRAN) 4 MG tablet Take 1 tablet (4 mg total) by mouth every 6 (six) hours. 04/24/22  Yes Blanchie Dessert, MD  ?predniSONE (DELTASONE) 20 MG tablet Take 2 tablets (40 mg total) by mouth  daily. 04/24/22  Yes Blanchie Dessert, MD  ?Blood Pressure KIT 1 each by Does not apply route in the morning and at bedtime. 02/28/20   Shelly Coss, MD  ?cyclobenzaprine (FLEXERIL) 10 MG tablet Take 1 tablet (10 mg total) by mouth 3 (three) times daily as needed for muscle spasms. 02/28/20   Shelly Coss, MD  ?Multiple Vitamin (MULTIVITAMIN WITH MINERALS) TABS tablet Take 1 tablet by mouth daily.    [provider]  ?   ? ?Allergies    ?Patient has no known allergies.   ? ?Review of Systems   ?Review of Systems  ?Respiratory:  Negative for shortness of breath.   ?Musculoskeletal:   ?     He was starting to get some muscle cramps and weakness in his legs which he had experienced in the past with profound hypokalemia  ? ?Physical Exam ?Updated Vital Signs ?BP 130/80 (BP Location: Right Arm)   Pulse 85   Temp 98.1 ?F (36.7 ?C) (Oral)   Resp 16   Ht 5' 6"  (1.676 m)   Wt 89.8 kg   SpO2 96%   BMI 31.96 kg/m?  ?Physical Exam ?Vitals and nursing note reviewed.  ?Constitutional:   ?   General: He is not in acute distress. ?   Appearance: He is well-developed.  ?HENT:  ?   Head: Normocephalic and atraumatic.  ?  Mouth/Throat:  ?   Mouth: Mucous membranes are dry.  ?Eyes:  ?   Conjunctiva/sclera: Conjunctivae normal.  ?   Pupils: Pupils are equal, round, and reactive to light.  ?Cardiovascular:  ?   Rate and Rhythm: Normal rate and regular rhythm.  ?   Pulses: Normal pulses.  ?   Heart sounds: No murmur heard. ?Pulmonary:  ?   Effort: Pulmonary effort is normal. No respiratory distress.  ?   Breath sounds: Normal breath sounds. No wheezing or rales.  ?Abdominal:  ?   General: There is no distension.  ?   Palpations: Abdomen is soft.  ?   Tenderness: There is no abdominal tenderness. There is no guarding or rebound.  ?Musculoskeletal:     ?   General: No tenderness. Normal range of motion.  ?   Cervical back: Normal range of motion and neck supple.  ?   Right lower leg: No edema.  ?   Left lower leg: No  edema.  ?Skin: ?   General: Skin is warm and dry.  ?   Findings: No erythema or rash.  ?Neurological:  ?   Mental Status: He is alert and oriented to person, place, and time. Mental status is at baseline.  ?Psychiatric:     ?   Mood and Affect: Mood normal.     ?   Behavior: Behavior normal.  ? ? ?ED Results / Procedures / Treatments   ?Labs ?(all labs ordered are listed, but only abnormal results are displayed) ?Labs Reviewed  ?COMPREHENSIVE METABOLIC PANEL - Abnormal; Notable for the following components:  ?    Result Value  ? Potassium 3.3 (*)   ? CO2 19 (*)   ? Glucose, Bld 161 (*)   ? BUN 28 (*)   ? Creatinine, Ser 1.33 (*)   ? Total Protein 8.4 (*)   ? All other components within normal limits  ?CBC WITH DIFFERENTIAL/PLATELET - Abnormal; Notable for the following components:  ? WBC 19.9 (*)   ? RBC 6.04 (*)   ? Hemoglobin 18.9 (*)   ? HCT 54.2 (*)   ? Neutro Abs 17.1 (*)   ? Abs Immature Granulocytes 0.09 (*)   ? All other components within normal limits  ?URINALYSIS, ROUTINE W REFLEX MICROSCOPIC - Abnormal; Notable for the following components:  ? Ketones, ur 5 (*)   ? All other components within normal limits  ?RESP PANEL BY RT-PCR (FLU A&B, COVID) ARPGX2  ?CK  ?MAGNESIUM  ?LIPASE, BLOOD  ? ? ?EKG ?EKG Interpretation ? ?Date/Time:  Wednesday Apr 24 2022 02:10:04 EDT ?Ventricular Rate:  92 ?PR Interval:  134 ?QRS Duration: 90 ?QT Interval:  360 ?QTC Calculation: 445 ?R Axis:   50 ?Text Interpretation: Normal sinus rhythm Nonspecific T wave abnormality No previous ECGs available Confirmed by Blanchie Dessert [phone]) on 04/24/2022 8:24:35 AM ? ?Radiology ?DG Chest 2 View ? ?Result Date: 04/24/2022 ?CLINICAL DATA:  Shortness of breath EXAM: CHEST - 2 VIEW COMPARISON:  None Available. FINDINGS: Lungs are clear.  No pleural effusion or pneumothorax. The heart is normal in size. Visualized osseous structures are within normal limits. IMPRESSION: Normal chest radiographs. Electronically Signed   By: Julian Hy  M.D.   On: 04/24/2022 02:40   ? ?Procedures ?Procedures  ? ? ?Medications Ordered in ED ?Medications  ?acetaminophen (TYLENOL) tablet 650 mg (650 mg Oral Given 04/24/22 0241)  ?albuterol (VENTOLIN HFA) 108 (90 Base) MCG/ACT inhaler 2 puff (has no administration in time range)  ?  ondansetron (ZOFRAN-ODT) disintegrating tablet 4 mg (4 mg Oral Given 04/24/22 0241)  ?lactated ringers bolus 1,000 mL (1,000 mLs Intravenous New Bag/Given 04/24/22 0904)  ?potassium chloride SA (KLOR-CON M) CR tablet 40 mEq (40 mEq Oral Given 04/24/22 0857)  ? ? ?ED Course/ Medical Decision Making/ A&P ?  ?                        ?Medical Decision Making ?Risk ?Prescription drug management. ? ? ?Patient is a well-appearing 41 year old male presenting with 2 separate complaints today.  Initially is the cough and wheezing that is most notable at night.  Patient does suffer from significant allergies and it seems that he is having exacerbation of his asthma related to this.  He is not currently wheezing and denies feeling short of breath at this time.  I independently visualized and interpreted patient's chest x-ray which is normal.  Low suspicion for pneumonia.  Patient will need an inhaler to go home with to use as needed for wheezing and nighttime cough.  It will be beneficial for him to follow-up with the PCP in the future as he may need a more maintenance therapy.  We will also discuss Zyrtec and Flonase. ? ?Secondly patient has had 14 hours of nausea vomiting and diarrhea.  This seems separate from the initial complaint.  Seems most likely viral in etiology or possible foodborne illness.  Patient has no localized abdominal pain concerning for appendicitis, pancreatitis, hepatitis or cholecystitis.  I independently interpreted patient's labs today he does have a leukocytosis of 20, hemoconcentration with a hemoglobin of almost 19 with a normal platelet count.  In the past patient has had significant leukocytosis related to stress that has been  similar in response.  CMP today with mild hypokalemia with a potassium of 3.3, mild AKI with creatinine of 1.33 which is most likely prerenal and related to dehydration, LFTs and lipase are within normal lim

## 2022-04-24 NOTE — ED Provider Triage Note (Signed)
Emergency Medicine Provider Triage Evaluation Note ? ?Agustine Arguelles-Martinez , a 41 y.o. male  was evaluated in triage.  Pt complains of  with a hx of HTN who presents to the ED with his significant other for evaluation of N/V/D, generalized abdominal pain, and muscle cramping that began tonight. Has had intermittent cough/wheezing/dyspnea for months, was having those sxs tonight as well. No recent foreign travel or abx. His wife reports prior admission for some similar sxs in the past.  ? ?History provided primarily by patient's significant other with his supplementation.  ? ?Review of Systems  ?Positive: Abdominal pain, N/V/D, dyspnea, cough, wheezing ?Negative: Hematemesis, melena, hematochezia, fever ? ?Physical Exam  ?BP (!) 125/93   Pulse 81   Resp 18   Ht 5\' 6"  (1.676 m)   Wt 89.8 kg   SpO2 95%   BMI 31.96 kg/m?  ?Gen:   Awake, no distress , clear speech.  ?Resp:  Normal effor, lungs CTA.  ?MSK:   Moves extremities without difficulty  ?Other:  Mild generalized abdominal TTP ? ?Medical Decision Making  ?Medically screening exam initiated at 2:10 AM.  Appropriate orders placed.  Aidin Arguelles-Martinez was informed that the remainder of the evaluation will be completed by another provider, this initial triage assessment does not replace that evaluation, and the importance of remaining in the ED until their evaluation is complete. ? ?N/V/D.  ?  Amaryllis Dyke, PA-C ?04/24/22 [id number] ? ?

## 2022-04-24 NOTE — Discharge Instructions (Addendum)
Make sure you are eating foods with high potassium and drinking gatorade, coconut water or pedialyte that has electrolytes in it to stay hydrated.  Also there is some information of other foods with high potassium. ?If you start having fever, severe abdominal pain, no improvement in vomiting/diarrhea or you start having worsening leg pain, weakness and cramping return to the ER. ?

## 2022-04-24 NOTE — ED Triage Notes (Signed)
Pt has had SOB with wheezing intermittently x "months" worsening today with new onset n/v/d cramping to back and hands  ?

## 2022-11-26 ENCOUNTER — Encounter (HOSPITAL_COMMUNITY): Payer: Self-pay

## 2022-11-26 ENCOUNTER — Emergency Department (HOSPITAL_COMMUNITY): Payer: 59

## 2022-11-26 ENCOUNTER — Emergency Department (HOSPITAL_COMMUNITY)
Admission: EM | Admit: 2022-11-26 | Discharge: 2022-11-26 | Disposition: A | Discharge status: Home / Self Care | Payer: 59 | Attending: Emergency Medicine | Admitting: Emergency Medicine

## 2022-11-26 ENCOUNTER — Other Ambulatory Visit: Payer: Self-pay

## 2022-11-26 DIAGNOSIS — S91011A Laceration without foreign body, right ankle, initial encounter: Secondary | ICD-10-CM

## 2022-11-26 DIAGNOSIS — S99912A Unspecified injury of left ankle, initial encounter: Secondary | ICD-10-CM | POA: Diagnosis present

## 2022-11-26 DIAGNOSIS — Y990 Civilian activity done for income or pay: Secondary | ICD-10-CM | POA: Diagnosis not present

## 2022-11-26 DIAGNOSIS — W25XXXA Contact with sharp glass, initial encounter: Secondary | ICD-10-CM | POA: Insufficient documentation

## 2022-11-26 DIAGNOSIS — S91012A Laceration without foreign body, left ankle, initial encounter: Secondary | ICD-10-CM | POA: Diagnosis not present

## 2022-11-26 MED ORDER — LIDOCAINE-EPINEPHRINE (PF) 2 %-1:200000 IJ SOLN
INTRAMUSCULAR | Status: AC
  Filled 2022-11-26: qty 20

## 2022-11-26 NOTE — ED Provider Notes (Signed)
[facility] EMERGENCY DEPARTMENT Provider Note   CSN: [id number] Arrival date & time: 11/26/22  1604     History  Chief Complaint  Patient presents with   Extremity Laceration    Sacha Radloff is a 41 y.o. male who presents for evaluation of injury to the left ankle.  He was at work when a large ground glass sheet fell on his left ankle.  It did break.  He has trouble moving ankle.  He is not up-to-date on his tetanus vaccination.  There is a language barrier and his wife provides translation.  HPI     Home Medications Prior to Admission medications   Medication Sig Start Date End Date Taking? Authorizing Provider  Blood Pressure KIT 1 each by Does not apply route in the morning and at bedtime. 02/28/20   Shelly Coss, MD  cyclobenzaprine (FLEXERIL) 10 MG tablet Take 1 tablet (10 mg total) by mouth 3 (three) times daily as needed for muscle spasms. 02/28/20   Shelly Coss, MD  Multiple Vitamin (MULTIVITAMIN WITH MINERALS) TABS tablet Take 1 tablet by mouth daily.    [provider]  ondansetron (ZOFRAN) 4 MG tablet Take 1 tablet (4 mg total) by mouth every 6 (six) hours. 04/24/22   Blanchie Dessert, MD  predniSONE (DELTASONE) 20 MG tablet Take 2 tablets (40 mg total) by mouth daily. 04/24/22   Blanchie Dessert, MD      Allergies    B-12 [cyanocobalamin]    Review of Systems   Review of Systems  Physical Exam Updated Vital Signs BP (!) 150/104   Pulse 69   Temp 97.9 F (36.6 C) (Oral)   Resp 20   Ht 5' 8" (1.727 m)   Wt 89.8 kg   SpO2 96%   BMI 30.11 kg/m  Physical Exam Vitals and nursing note reviewed.  Constitutional:      General: He is not in acute distress.    Appearance: He is well-developed. He is not diaphoretic.  HENT:     Head: Normocephalic and atraumatic.  Eyes:     General: No scleral icterus.    Conjunctiva/sclera: Conjunctivae normal.  Cardiovascular:     Rate and Rhythm: Normal rate and regular  rhythm.     Heart sounds: Normal heart sounds.  Pulmonary:     Effort: Pulmonary effort is normal. No respiratory distress.     Breath sounds: Normal breath sounds.  Abdominal:     Palpations: Abdomen is soft.     Tenderness: There is no abdominal tenderness.  Musculoskeletal:        General: Normal range of motion.     Cervical back: Normal range of motion and neck supple.     Comments: Left ankle with 2 small lacerations to the skin over the left lateral malleolus.  Full range of motion and strength of the left lower extremity, neurovascularly intact  Skin:    General: Skin is warm and dry.     Comments: 2 lacerations over the lateral malleolus  Neurological:     Mental Status: He is alert and oriented to person, place, and time.     Deep Tendon Reflexes: Reflexes are normal and symmetric.  Psychiatric:        Behavior: Behavior normal.     ED Results / Procedures / Treatments   Labs (all labs ordered are listed, but only abnormal results are displayed) Labs Reviewed - No data to display  EKG None  Radiology No results found.  Procedures .Marland Kitchen  Laceration Repair  Date/Time: 11/26/2022 5:32 PM  Performed by: Margarita Mail, PA-C Authorized by: Margarita Mail, PA-C   Consent:    Consent obtained:  Verbal   Consent given by:  Patient   Risks discussed:  Infection, need for additional repair, nerve damage, retained foreign body, tendon damage, vascular damage, pain and poor cosmetic result   Alternatives discussed:  Referral Universal protocol:    Patient identity confirmed:  Arm band Anesthesia:    Anesthesia method:  Local infiltration   Local anesthetic:  Lidocaine 1% WITH epi Laceration details:    Location:  Foot   Foot location:  L ankle   Length (cm):  4 Exploration:    Hemostasis achieved with:  Epinephrine   Imaging obtained: x-ray     Imaging outcome: foreign body noted     Wound exploration: wound explored through full range of motion     Wound  extent: areolar tissue not violated, no foreign body, no signs of injury, no nerve damage, no tendon damage, no underlying fracture and no vascular damage   Treatment:    Area cleansed with:  Povidone-iodine   Amount of cleaning:  Standard   Irrigation solution:  Sterile saline   Irrigation method:  Syringe Skin repair:    Repair method:  Sutures   Suture size:  4-0   Suture material:  Prolene   Suture technique:  Running locked   Number of sutures:  3 Approximation:    Approximation:  Close Repair type:    Repair type:  Simple Post-procedure details:    Dressing:  Non-adherent dressing   Procedure completion:  Tolerated well, no immediate complications .Marland KitchenLaceration Repair  Date/Time: 11/26/2022 5:37 PM  Performed by: Margarita Mail, PA-C Authorized by: Margarita Mail, PA-C   Consent:    Consent obtained:  Verbal   Consent given by:  Patient   Risks, benefits, and alternatives were discussed: yes     Risks discussed:  Infection, need for additional repair, nerve damage, pain, poor cosmetic result, poor wound healing, vascular damage, tendon damage and retained foreign body   Alternatives discussed:  No treatment Universal protocol:    Patient identity confirmed:  Arm band Anesthesia:    Anesthesia method:  Local infiltration   Local anesthetic:  Lidocaine 1% WITH epi Laceration details:    Location:  Foot   Foot location:  L ankle   Length (cm):  3 Pre-procedure details:    Preparation:  Patient was prepped and draped in usual sterile fashion Exploration:    Limited defect created (wound extended): yes     Imaging obtained: x-ray     Imaging outcome: foreign body not noted     Wound exploration: wound explored through full range of motion and entire depth of wound visualized     Wound extent: areolar tissue not violated, fascia not violated, no foreign body, no signs of injury, no nerve damage, no tendon damage, no underlying fracture and no vascular damage    Treatment:    Area cleansed with:  Povidone-iodine   Amount of cleaning:  Standard   Irrigation solution:  Sterile saline   Irrigation method:  Syringe   Visualized foreign bodies/material removed: no   Skin repair:    Repair method:  Sutures   Suture size:  4-0   Suture material:  Prolene   Suture technique:  Running locked   Number of sutures:  3 Approximation:    Approximation:  Close Repair type:    Repair type:  Simple  Post-procedure details:    Dressing:  Adhesive bandage   Procedure completion:  Tolerated well, no immediate complications     Medications Ordered in ED Medications  lidocaine-EPINEPHrine (XYLOCAINE W/EPI) 2 %-1:200000 (PF) injection (  Given by Other 11/26/22 1632)    ED Course/ Medical Decision Making/ A&P                           Medical Decision Making Amount and/or Complexity of Data Reviewed Radiology: ordered and independent interpretation performed.    Details: I visualized and interpreted left ankle x-ray which showed no acute findings            Final Clinical Impression(s) / ED Diagnoses Final diagnoses:  Laceration of right ankle, initial encounter    Rx / DC Orders ED Discharge Orders     None         Margarita Mail, PA-C 11/26/22 2224    Davonna Belling, MD 11/26/22 2337

## 2022-11-26 NOTE — ED Notes (Signed)
Abigial PA at bedside suturing the patient.

## 2022-11-26 NOTE — ED Notes (Signed)
Pt able to demonstrate proper use of crutches prior to discharge. Pt A&OX4 and verbalized understanding of d/c instructions, wound care and follow up care.

## 2022-11-26 NOTE — Discharge Instructions (Signed)
WOUND CARE Please have your stitches/staples removed in 10 days  or sooner if you have concerns. You may do this at any available urgent care or at your primary care doctor's office.  Keep area clean and dry for 24 hours. Do not remove bandage, if applied.  After 24 hours, remove bandage and wash wound gently with mild soap and warm water. Reapply a new bandage after cleaning wound, if directed.  Continue daily cleansing with soap and water until stitches/staples are removed.  Do not apply any ointments or creams to the wound while stitches/staples are in place, as this may cause delayed healing.  Seek medical careif you experience any of the following signs of infection: Swelling, redness, pus drainage, streaking, fever >101.0 F  Seek care if you experience excessive bleeding that does not stop after 15-20 minutes of constant, firm pressure.   

## 2022-11-26 NOTE — ED Triage Notes (Signed)
Pt reports a large sheet of glass from his dining room table broke and cut his left lateral ankle. He reports pain with any movement of his foot. Bleeding controlled, +pedal pulse. Last tetanus unknown.

## 2023-06-30 IMAGING — CR DG CHEST 2V
2 series · 2 of 2 positions shown · non-contrast
Comparison: None Available.

CLINICAL DATA: Shortness of breath

EXAM:
CHEST - 2 VIEW

[chest pa]
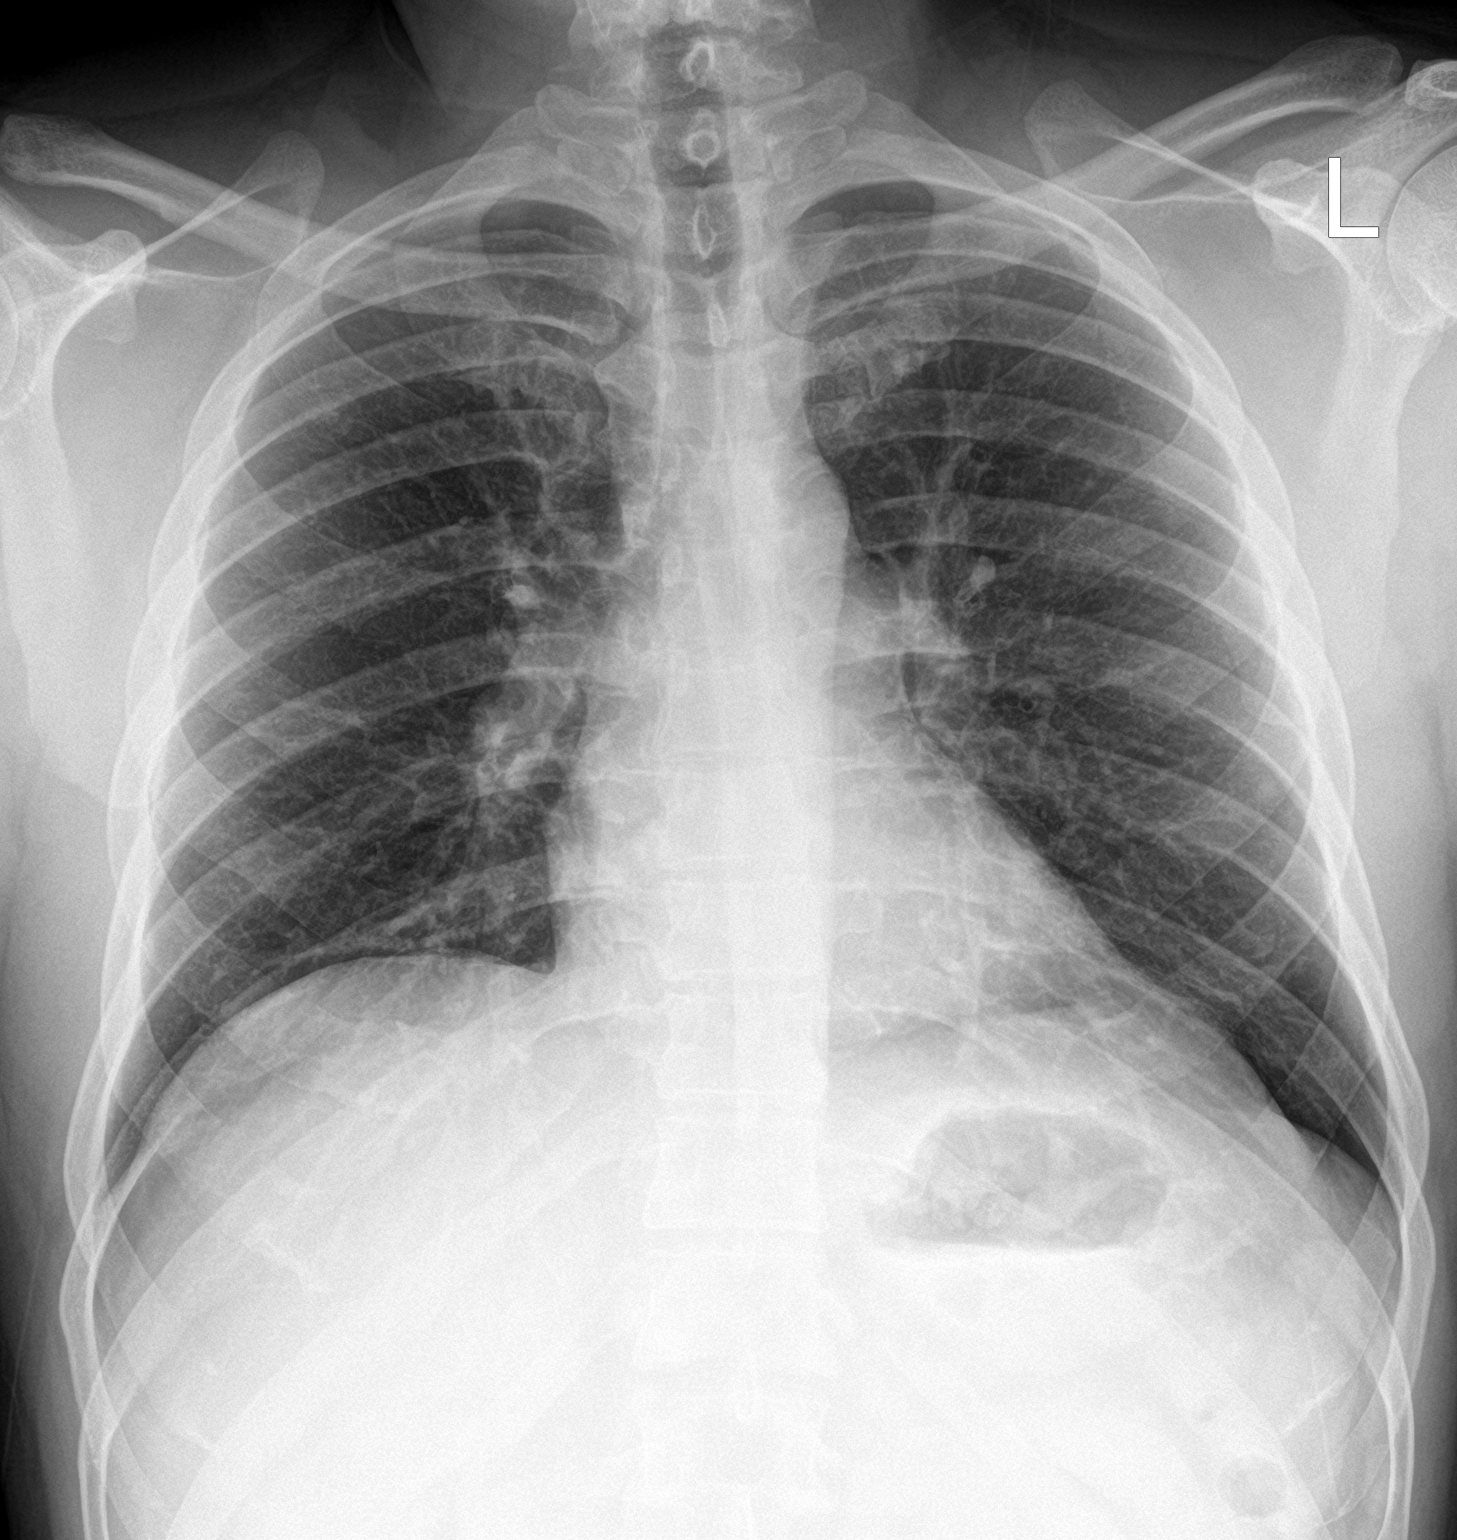

[chest lat]
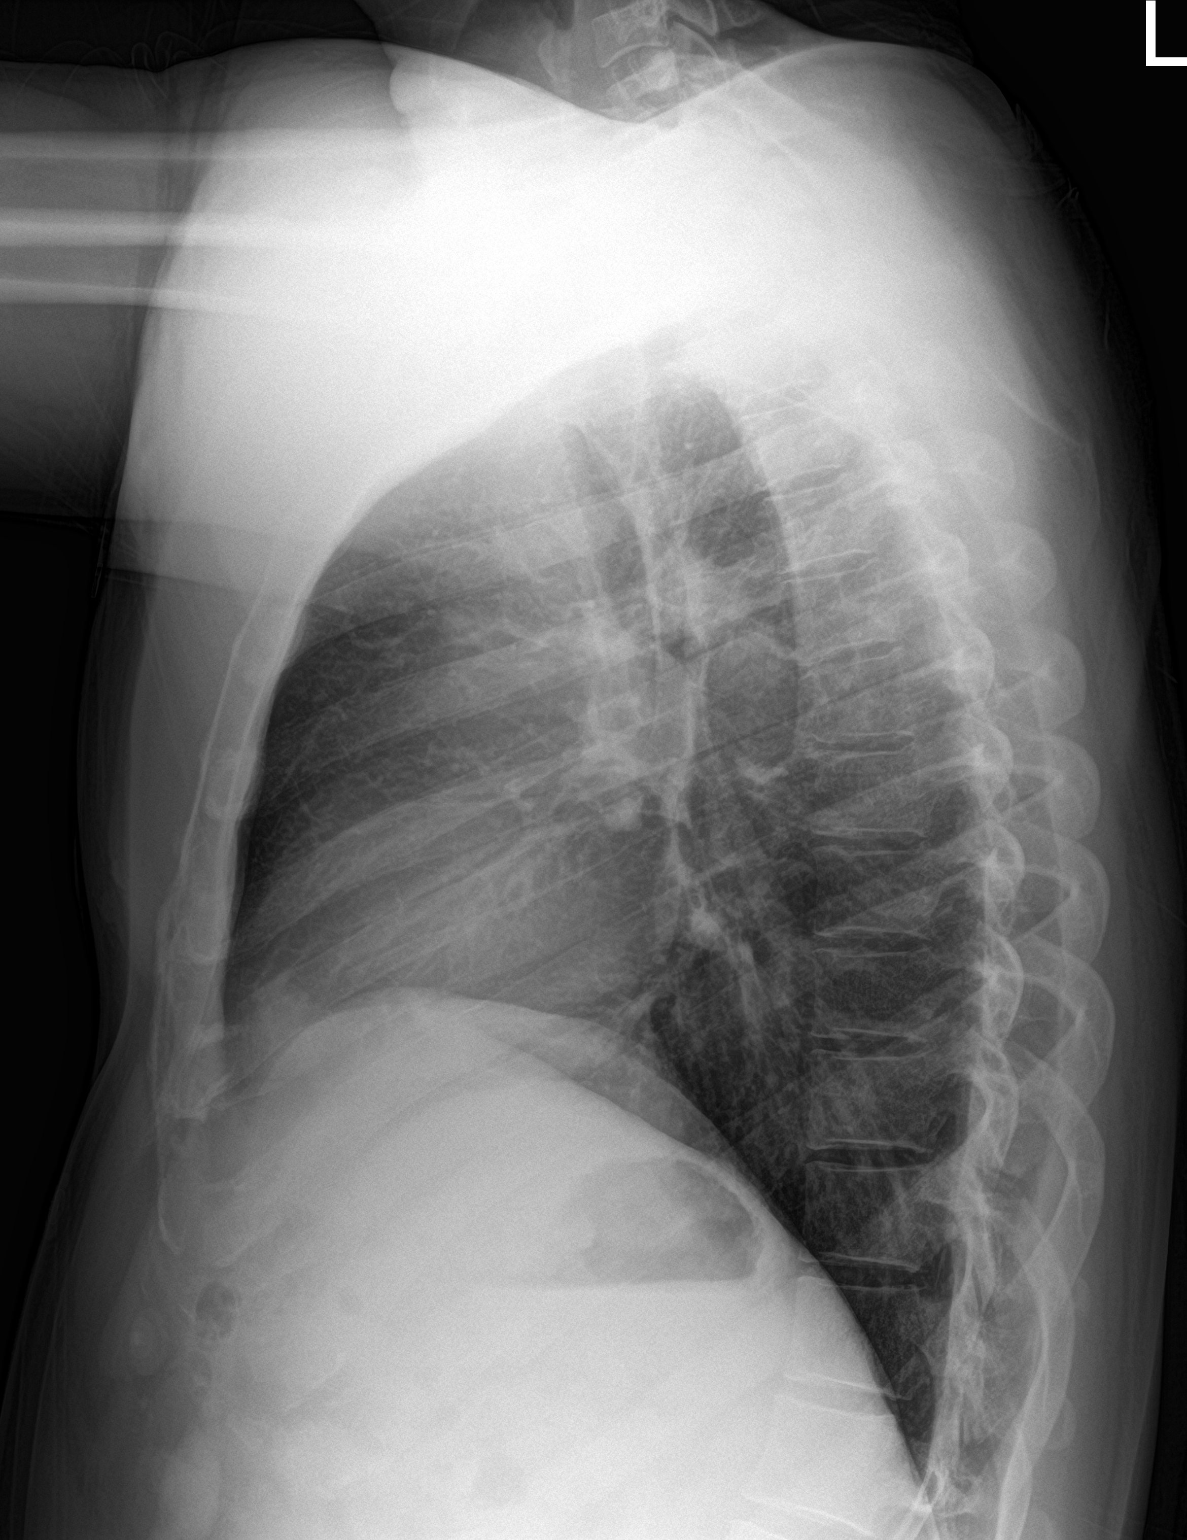

[2 of 2 positions shown; findings below may reference images not displayed]

FINDINGS: Lungs are clear.  No pleural effusion or pneumothorax.

The heart is normal in size.

Visualized osseous structures are within normal limits.
IMPRESSION: Normal chest radiographs.

## 2024-06-16 ENCOUNTER — Ambulatory Visit (HOSPITAL_BASED_OUTPATIENT_CLINIC_OR_DEPARTMENT_OTHER)
Admission: RE | Admit: 2024-06-16 | Discharge: 2024-06-16 | Disposition: A | Discharge status: Home / Self Care | Source: Ambulatory Visit | Attending: Family Medicine | Admitting: Family Medicine

## 2024-06-16 ENCOUNTER — Other Ambulatory Visit (HOSPITAL_BASED_OUTPATIENT_CLINIC_OR_DEPARTMENT_OTHER): Payer: Self-pay

## 2024-06-16 ENCOUNTER — Encounter (HOSPITAL_BASED_OUTPATIENT_CLINIC_OR_DEPARTMENT_OTHER): Payer: Self-pay

## 2024-06-16 VITALS — BP 151/93 | HR 62 | Temp 98.4°F | Resp 20

## 2024-06-16 DIAGNOSIS — I10 Essential (primary) hypertension: Secondary | ICD-10-CM | POA: Diagnosis not present

## 2024-06-16 LAB — POCT URINALYSIS DIP (MANUAL ENTRY)
Bilirubin, UA: NEGATIVE
Blood, UA: NEGATIVE
Glucose, UA: NEGATIVE mg/dL
Ketones, POC UA: NEGATIVE mg/dL
Leukocytes, UA: NEGATIVE
Nitrite, UA: NEGATIVE
Protein Ur, POC: NEGATIVE mg/dL
Spec Grav, UA: 1.02 (ref 1.010–1.025)
Urobilinogen, UA: 0.2 U/dL
pH, UA: 6 (ref 5.0–8.0)

## 2024-06-16 LAB — POCT FASTING CBG KUC MANUAL ENTRY: POCT Glucose (KUC): 92 mg/dL (ref 70–99)

## 2024-06-16 MED ORDER — LOSARTAN POTASSIUM 50 MG PO TABS
50.0000 mg | ORAL_TABLET | Freq: Every day | ORAL | 1 refills | Status: DC
  Filled 2024-06-16: qty 30, 30d supply, fill #0

## 2024-06-16 NOTE — ED Triage Notes (Signed)
 Pts wife states the pt has been having issues with high blood pressure. He is working on getting in with PCP but has not been able to get seen quickly. He has been having HA, nausea, dizziness, fatigue, chest pain, and SOBr. They have checked bp at home some and it has been around 179/99.

## 2024-06-16 NOTE — Discharge Instructions (Signed)
 Your EKG was normal Your urine was normal and your blood sugar was normal We have gotten some blood work to check your basic lab work to include liver, kidney function and electrolytes We are also checking your white blood cells and red blood cells. I am restarting you on your blood pressure medication Please follow-up with primary care as planned

## 2024-06-16 NOTE — ED Provider Notes (Signed)
 [facility]    CSN: [id number] Arrival date & time: 06/16/24  1428      History   Chief Complaint Chief Complaint  Patient presents with   Hypertension    Chest pain - Entered by patient    HPI William Salinas is a 43 y.o. male.   Patient is a 43 year old male with past medical history of hypertension, hyperlipidemia.  He presents today with complaints of intermittent chest pains, dizziness, headache, nausea, shortness of breath, increased urination and thirst.  The symptoms come and go.  He was previously on blood pressure medication a few years back but has not had a PCP in over 2 years.  His wife has been taking his blood pressure readings at home with highest being 175 systolic.  She reports that he gets up at least 4-5 times a night and urinates.  He is also increasingly thirsty.  He is under a lot of stress at work due to high demand of job.  He drinks mostly water.  He does have some anxiety from time to time per his wife and feels like this causes some of his shortness of breath and chest pains.  They are wanting to reestablish with primary care to better manage his health problems.   Hypertension    Past Medical History:  Diagnosis Date   Hypertension     Patient Active Problem List   Diagnosis Date Noted   Muscle weakness (generalized) 02/27/2020   Hypokalemia 02/27/2020   HTN (hypertension) 02/27/2020   Obesity (BMI 30-39.9) 02/27/2020   Dehydration 02/27/2020    History reviewed. No pertinent surgical history.     Home Medications    Prior to Admission medications   Medication Sig Start Date End Date Taking? Authorizing Provider  losartan  (COZAAR ) 50 MG tablet Take 1 tablet (50 mg total) by mouth daily. 06/16/24  Yes Roshan Roback A, FNP  Blood Pressure KIT 1 each by Does not apply route in the morning and at bedtime. 02/28/20   Jillian Buttery, MD    Family History History reviewed. No pertinent family history.  Social  History Social History   Tobacco Use   Smoking status: Never   Smokeless tobacco: Never  Vaping Use   Vaping status: Never Used  Substance Use Topics   Alcohol use: Never   Drug use: Never     Allergies   B-12 [cyanocobalamin]   Review of Systems Review of Systems See HPI  Physical Exam Triage Vital Signs ED Triage Vitals  Encounter Vitals Group     BP 06/16/24 1440 (!) 151/93     Girls Systolic BP Percentile --      Girls Diastolic BP Percentile --      Boys Systolic BP Percentile --      Boys Diastolic BP Percentile --      Pulse Rate 06/16/24 1440 62     Resp 06/16/24 1440 20     Temp 06/16/24 1440 98.4 F (36.9 C)     Temp Source 06/16/24 1440 Oral     SpO2 06/16/24 1440 95 %     Weight --      Height --      Head Circumference --      Peak Flow --      Pain Score 06/16/24 1436 3     Pain Loc --      Pain Education --      Exclude from Growth Chart --    No data found.  Updated Vital Signs BP (!) 151/93 (BP Location: Right Arm)   Pulse 62   Temp 98.4 F (36.9 C) (Oral)   Resp 20   SpO2 95%   Visual Acuity Right Eye Distance:   Left Eye Distance:   Bilateral Distance:    Right Eye Near:   Left Eye Near:    Bilateral Near:     Physical Exam Vitals and nursing note reviewed.  Constitutional:      General: He is not in acute distress.    Appearance: Normal appearance. He is not ill-appearing, toxic-appearing or diaphoretic.  HENT:     Nose: Nose normal.  Eyes:     Conjunctiva/sclera: Conjunctivae normal.  Cardiovascular:     Rate and Rhythm: Normal rate and regular rhythm.  Pulmonary:     Effort: Pulmonary effort is normal.     Breath sounds: Normal breath sounds.  Musculoskeletal:        General: Normal range of motion.     Cervical back: Normal range of motion.  Skin:    General: Skin is warm and dry.  Neurological:     General: No focal deficit present.     Mental Status: He is alert.  Psychiatric:        Mood and Affect: Mood  normal.        Behavior: Behavior normal.      UC Treatments / Results  Labs (all labs ordered are listed, but only abnormal results are displayed) Labs Reviewed  POCT FASTING CBG [facility] MANUAL ENTRY - Normal  POCT URINALYSIS DIP (MANUAL ENTRY) - Normal  CBC  COMPREHENSIVE METABOLIC PANEL WITH GFR  HEMOGLOBIN A1C    EKG   Radiology No results found.  Procedures Procedures (including critical care time)  Medications Ordered in UC Medications - No data to display  Initial Impression / Assessment and Plan / UC Course  I have reviewed the triage vital signs and the nursing notes.  Pertinent labs & imaging results that were available during my care of the patient were reviewed by me and considered in my medical decision making (see chart for details).     Hypertension-patient with past medical history of hypertension currently not on any blood pressure medication.  He has been having elevated blood pressures at home and symptoms to include dizziness, headaches.  Blood pressure elevated twice here today at 151 systolic and 145 systolic checked on different arms. EKG with normal sinus rhythm and normal rate.  No concerns or ST changes. Believe a lot of his problems could be stress and anxiety induced. I will go ahead and restart his blood pressure medication today. Was previously on Losartan  and Amlodipine. We Will start only Losartan  today 50 mg. Was on 100 mg previously.  We will have him follow-up with primary care for further management of this. CBC and CMP obtained.   Urinary frequency and increased thirst-blood work obtained today to include CMP and CBC, A1c.  CBG here was 92.  Not concern for diabetes at this time. May need to see urology in case this is due to BPH. He does get pain in the abdomen from time to time into his back. ? Kidney stones. PCP can do urology referral if needed.    Final Clinical Impressions(s) / UC Diagnoses   Final diagnoses:  Hypertension,  unspecified type     Discharge Instructions      Your EKG was normal Your urine was normal and your blood sugar was normal We have gotten  some blood work to check your basic lab work to include liver, kidney function and electrolytes We are also checking your white blood cells and red blood cells. I am restarting you on your blood pressure medication Please follow-up with primary care as planned     ED Prescriptions     Medication Sig Dispense Auth. Provider   losartan  (COZAAR ) 50 MG tablet Take 1 tablet (50 mg total) by mouth daily. 30 tablet Adah Wilbert LABOR, FNP      PDMP not reviewed this encounter.   Adah Wilbert LABOR, FNP 06/16/24 1605

## 2024-06-17 ENCOUNTER — Ambulatory Visit (HOSPITAL_BASED_OUTPATIENT_CLINIC_OR_DEPARTMENT_OTHER): Admitting: Family Medicine

## 2024-06-17 ENCOUNTER — Ambulatory Visit (HOSPITAL_COMMUNITY): Payer: Self-pay

## 2024-06-17 ENCOUNTER — Encounter (HOSPITAL_BASED_OUTPATIENT_CLINIC_OR_DEPARTMENT_OTHER): Payer: Self-pay | Admitting: Family Medicine

## 2024-06-17 VITALS — BP 130/89 | HR 102 | Temp 98.0°F | Resp 16 | Ht 66.93 in | Wt 187.2 lb

## 2024-06-17 DIAGNOSIS — R5383 Other fatigue: Secondary | ICD-10-CM | POA: Diagnosis not present

## 2024-06-17 DIAGNOSIS — M545 Low back pain, unspecified: Secondary | ICD-10-CM | POA: Diagnosis not present

## 2024-06-17 DIAGNOSIS — R350 Frequency of micturition: Secondary | ICD-10-CM

## 2024-06-17 DIAGNOSIS — Z0000 Encounter for general adult medical examination without abnormal findings: Secondary | ICD-10-CM | POA: Diagnosis not present

## 2024-06-17 DIAGNOSIS — G8929 Other chronic pain: Secondary | ICD-10-CM

## 2024-06-17 LAB — COMPREHENSIVE METABOLIC PANEL WITH GFR
ALT: 40 IU/L (ref 0–44)
AST: 32 IU/L (ref 0–40)
Albumin: 4.4 g/dL (ref 4.1–5.1)
Alkaline Phosphatase: 79 IU/L (ref 44–121)
BUN/Creatinine Ratio: 12 (ref 9–20)
BUN: 14 mg/dL (ref 6–24)
Bilirubin Total: 0.4 mg/dL (ref 0.0–1.2)
CO2: 20 mmol/L (ref 20–29)
Calcium: 9.5 mg/dL (ref 8.7–10.2)
Chloride: 105 mmol/L (ref 96–106)
Creatinine, Ser: 1.17 mg/dL (ref 0.76–1.27)
Globulin, Total: 2.9 g/dL (ref 1.5–4.5)
Glucose: 100 mg/dL — ABNORMAL HIGH (ref 70–99)
Potassium: 4.1 mmol/L (ref 3.5–5.2)
Sodium: 139 mmol/L (ref 134–144)
Total Protein: 7.3 g/dL (ref 6.0–8.5)
eGFR: 80 mL/min/1.73 (ref >59)

## 2024-06-17 LAB — CBC
Hematocrit: 52.2 % — ABNORMAL HIGH (ref 37.5–51.0)
Hemoglobin: 17.4 g/dL (ref 13.0–17.7)
MCH: 31.6 pg (ref 26.6–33.0)
MCHC: 33.3 g/dL (ref 31.5–35.7)
MCV: 95 fL (ref 79–97)
Platelets: 199 x10E3/uL (ref 150–450)
RBC: 5.5 x10E6/uL (ref 4.14–5.80)
RDW: 12.9 % (ref 11.6–15.4)
WBC: 8.4 x10E3/uL (ref 3.4–10.8)

## 2024-06-17 LAB — HEMOGLOBIN A1C
Est. average glucose Bld gHb Est-mCnc: 108 mg/dL
Hgb A1c MFr Bld: 5.4 % (ref 4.8–5.6)

## 2024-06-17 NOTE — Progress Notes (Signed)
 Established Patient Office Visit  Subjective   Patient ID: William Salinas, male    DOB: Sep 14, 1981  Age: 43 y.o. MRN: [id number]  Chief Complaint  Patient presents with   Establish Care    Establish care     Wife is present and is a good Nurse, learning disability.  F/u as above.  New to my practice.  Requests a CPE today.  He also reports years of spinal pain in the thoracolumbar region.  His job is quite physical and his pain is clearly mechanical in nature.  He has previously benefited from spinal injections in [facility].  This issue appears stable, though he is also frustrated with bilateral LE weakness.  No difficulties with bowels.  Some frustrations with nocturnal polyuria, and reports minimal caffeine and evening fluids.    Past Medical History:  Diagnosis Date   Allergies    Chronic back pain    f/by pain specialist   GERD (gastroesophageal reflux disease)    Hypertension     Outpatient Encounter Medications as of 06/17/2024  Medication Sig   albuterol  (VENTOLIN  HFA) 108 (90 Base) MCG/ACT inhaler Inhale 2 puffs into the lungs.   losartan -hydrochlorothiazide (HYZAAR) 100-12.5 MG tablet Take 1 tablet by mouth daily.   mometasone (NASONEX) 50 MCG/ACT nasal spray Place 2 sprays into the nose.   pantoprazole (PROTONIX) 40 MG tablet Take 40 mg by mouth.   Blood Pressure KIT 1 each by Does not apply route in the morning and at bedtime.   losartan  (COZAAR ) 50 MG tablet Take 1 tablet (50 mg total) by mouth daily.   No facility-administered encounter medications on file as of 06/17/2024.    Social History   Tobacco Use   Smoking status: Never   Smokeless tobacco: Never  Vaping Use   Vaping status: Never Used  Substance Use Topics   Alcohol use: Never   Drug use: Never   Family History  Problem Relation Age of Onset   Hypertension Mother        Otherwise denied in first degree relatives.       Review of Systems  Constitutional: Negative.   HENT: Negative.     Eyes: Negative.   Respiratory: Negative.    Cardiovascular: Negative.   Gastrointestinal: Negative.   Genitourinary:  Positive for frequency. Negative for dysuria, flank pain, hematuria and urgency.  Musculoskeletal:  Positive for back pain.  Skin: Negative.   Neurological: Negative.   Endo/Heme/Allergies: Negative.   Psychiatric/Behavioral: Negative.        Objective:     BP 130/89 (BP Location: Right Arm, Patient Position: Standing, Cuff Size: Normal)   Pulse (!) 102   Temp 98 F (36.7 C) (Oral)   Resp 16   Ht 5' 6.93 (1.7 m)   Wt 187 lb 3.7 oz (84.9 kg)   SpO2 95%   BMI 29.39 kg/m    Physical Exam Constitutional:      General: He is not in acute distress.    Appearance: Normal appearance. He is normal weight.  HENT:     Head: Normocephalic.     Comments: Advised eye doctor and dentist    Right Ear: Tympanic membrane normal.     Left Ear: Tympanic membrane normal.     Nose: Nose normal.     Mouth/Throat:     Pharynx: Oropharynx is clear.  Eyes:     Extraocular Movements: Extraocular movements intact.     Conjunctiva/sclera: Conjunctivae normal.     Pupils: Pupils are equal, round,  and reactive to light.  Neck:     Vascular: No carotid bruit.  Cardiovascular:     Rate and Rhythm: Normal rate and regular rhythm.     Pulses: Normal pulses.     Heart sounds: Normal heart sounds.  Pulmonary:     Effort: Pulmonary effort is normal.     Breath sounds: Normal breath sounds.  Abdominal:     General: Bowel sounds are normal.     Palpations: Abdomen is soft. There is no mass.     Tenderness: There is no abdominal tenderness.     Hernia: No hernia is present.  Genitourinary:    Penis: Normal.      Testes: Normal.  Musculoskeletal:        General: No swelling, tenderness or deformity. Normal range of motion.     Cervical back: Neck supple. No tenderness.     Right lower leg: No edema.     Left lower leg: No edema.     Comments: Mild diffuse back pain.   Negative SLR's.  Skin:    General: Skin is warm and dry.     Comments: No concerning lesions  Neurological:     General: No focal deficit present.     Mental Status: He is alert and oriented to person, place, and time.     Cranial Nerves: No cranial nerve deficit.     Motor: No weakness.     Coordination: Coordination normal.     Gait: Gait normal.     Deep Tendon Reflexes: Reflexes normal.  Psychiatric:        Mood and Affect: Mood normal.        Behavior: Behavior normal.      No results found for any visits on 06/17/24.    The ASCVD Risk score (Arnett DK, et al., 2019) failed to calculate for the following reasons:   Cannot find a previous HDL lab   Cannot find a previous total cholesterol lab    Assessment & Plan:  Wellness examination -     Comprehensive metabolic panel with GFR -     CBC with Differential/Platelet -     Lipid panel  Chronic bilateral low back pain, unspecified whether sciatica present -     Ambulatory referral to Spine Surgery  Urinary frequency -     POCT urinalysis dipstick  Fatigue, unspecified type -     Vitamin B12    Return in about 3 months (around 09/17/2024) for chronic follow-up.    REDDING PONCE NORLEEN FALCON., MD

## 2024-06-17 NOTE — Assessment & Plan Note (Signed)
 Commended on healthy habits.  He requests another opinion from a different spine specialist.  Await labs and f/u with his wife soon.  May d/c his thiazide soon due to urinary struggles.  F/u items include a vaccine review.  Extended time on his care today beyond a basic wellness exam.

## 2024-06-18 ENCOUNTER — Ambulatory Visit (HOSPITAL_BASED_OUTPATIENT_CLINIC_OR_DEPARTMENT_OTHER): Payer: Self-pay | Admitting: Family Medicine

## 2024-06-18 LAB — CBC WITH DIFFERENTIAL/PLATELET
Basophils Absolute: 0 x10E3/uL (ref 0.0–0.2)
Basos: 1 %
EOS (ABSOLUTE): 0.1 x10E3/uL (ref 0.0–0.4)
Eos: 1 %
Hematocrit: 52.4 % — ABNORMAL HIGH (ref 37.5–51.0)
Hemoglobin: 17.4 g/dL (ref 13.0–17.7)
Immature Grans (Abs): 0 x10E3/uL (ref 0.0–0.1)
Immature Granulocytes: 0 %
Lymphocytes Absolute: 2.5 x10E3/uL (ref 0.7–3.1)
Lymphs: 30 %
MCH: 31.4 pg (ref 26.6–33.0)
MCHC: 33.2 g/dL (ref 31.5–35.7)
MCV: 94 fL (ref 79–97)
Monocytes Absolute: 0.6 x10E3/uL (ref 0.1–0.9)
Monocytes: 7 %
Neutrophils Absolute: 5 x10E3/uL (ref 1.4–7.0)
Neutrophils: 61 %
Platelets: 213 x10E3/uL (ref 150–450)
RBC: 5.55 x10E6/uL (ref 4.14–5.80)
RDW: 13 % (ref 11.6–15.4)
WBC: 8.2 x10E3/uL (ref 3.4–10.8)

## 2024-06-18 LAB — COMPREHENSIVE METABOLIC PANEL WITH GFR
ALT: 39 IU/L (ref 0–44)
AST: 31 IU/L (ref 0–40)
Albumin: 4.4 g/dL (ref 4.1–5.1)
Alkaline Phosphatase: 78 IU/L (ref 44–121)
BUN/Creatinine Ratio: 15 (ref 9–20)
BUN: 17 mg/dL (ref 6–24)
Bilirubin Total: 0.6 mg/dL (ref 0.0–1.2)
CO2: 19 mmol/L — ABNORMAL LOW (ref 20–29)
Calcium: 10 mg/dL (ref 8.7–10.2)
Chloride: 107 mmol/L — ABNORMAL HIGH (ref 96–106)
Creatinine, Ser: 1.14 mg/dL (ref 0.76–1.27)
Globulin, Total: 2.7 g/dL (ref 1.5–4.5)
Glucose: 94 mg/dL (ref 70–99)
Potassium: 4.2 mmol/L (ref 3.5–5.2)
Sodium: 142 mmol/L (ref 134–144)
Total Protein: 7.1 g/dL (ref 6.0–8.5)
eGFR: 83 mL/min/1.73 (ref >59)

## 2024-06-18 LAB — LIPID PANEL
Chol/HDL Ratio: 6.5 ratio — ABNORMAL HIGH (ref 0.0–5.0)
Cholesterol, Total: 208 mg/dL — ABNORMAL HIGH (ref 100–199)
HDL: 32 mg/dL — ABNORMAL LOW (ref >39)
LDL Chol Calc (NIH): 124 mg/dL — ABNORMAL HIGH (ref 0–99)
Triglycerides: 296 mg/dL — ABNORMAL HIGH (ref 0–149)
VLDL Cholesterol Cal: 52 mg/dL — ABNORMAL HIGH (ref 5–40)

## 2024-06-18 LAB — VITAMIN B12: Vitamin B-12: 587 pg/mL (ref 232–1245)

## 2024-06-22 ENCOUNTER — Other Ambulatory Visit (HOSPITAL_BASED_OUTPATIENT_CLINIC_OR_DEPARTMENT_OTHER): Payer: Self-pay

## 2024-06-22 DIAGNOSIS — R350 Frequency of micturition: Secondary | ICD-10-CM

## 2024-06-23 ENCOUNTER — Ambulatory Visit (HOSPITAL_BASED_OUTPATIENT_CLINIC_OR_DEPARTMENT_OTHER): Payer: Self-pay | Admitting: Family Medicine

## 2024-06-23 ENCOUNTER — Other Ambulatory Visit (HOSPITAL_BASED_OUTPATIENT_CLINIC_OR_DEPARTMENT_OTHER)

## 2024-06-23 DIAGNOSIS — R350 Frequency of micturition: Secondary | ICD-10-CM

## 2024-06-23 LAB — POCT URINALYSIS DIPSTICK
Bilirubin, UA: NEGATIVE
Blood, UA: NEGATIVE
Glucose, UA: NEGATIVE
Ketones, UA: NEGATIVE
Leukocytes, UA: NEGATIVE
Nitrite, UA: NEGATIVE
Protein, UA: POSITIVE — AB
Spec Grav, UA: >=1.03 — AB (ref 1.010–1.025)
Urobilinogen, UA: 0.2 U/dL
pH, UA: 6 (ref 5.0–8.0)

## 2024-07-08 ENCOUNTER — Encounter (HOSPITAL_BASED_OUTPATIENT_CLINIC_OR_DEPARTMENT_OTHER): Payer: Self-pay

## 2024-07-27 ENCOUNTER — Other Ambulatory Visit (HOSPITAL_BASED_OUTPATIENT_CLINIC_OR_DEPARTMENT_OTHER): Payer: Self-pay

## 2024-07-27 ENCOUNTER — Encounter (HOSPITAL_BASED_OUTPATIENT_CLINIC_OR_DEPARTMENT_OTHER): Payer: Self-pay

## 2024-07-27 ENCOUNTER — Ambulatory Visit (INDEPENDENT_AMBULATORY_CARE_PROVIDER_SITE_OTHER): Admit: 2024-07-27 | Discharge: 2024-07-27 | Disposition: A | Discharge status: Home / Self Care | Admitting: Radiology

## 2024-07-27 ENCOUNTER — Ambulatory Visit (HOSPITAL_BASED_OUTPATIENT_CLINIC_OR_DEPARTMENT_OTHER)
Admission: RE | Admit: 2024-07-27 | Discharge: 2024-07-27 | Disposition: A | Discharge status: Home / Self Care | Source: Ambulatory Visit | Attending: Family Medicine | Admitting: Family Medicine

## 2024-07-27 VITALS — BP 161/106 | HR 69 | Temp 97.6°F | Resp 20

## 2024-07-27 DIAGNOSIS — H6123 Impacted cerumen, bilateral: Secondary | ICD-10-CM | POA: Diagnosis not present

## 2024-07-27 DIAGNOSIS — R1031 Right lower quadrant pain: Secondary | ICD-10-CM | POA: Diagnosis not present

## 2024-07-27 DIAGNOSIS — R350 Frequency of micturition: Secondary | ICD-10-CM | POA: Diagnosis not present

## 2024-07-27 DIAGNOSIS — R197 Diarrhea, unspecified: Secondary | ICD-10-CM

## 2024-07-27 DIAGNOSIS — R110 Nausea: Secondary | ICD-10-CM

## 2024-07-27 DIAGNOSIS — R1032 Left lower quadrant pain: Secondary | ICD-10-CM

## 2024-07-27 DIAGNOSIS — R1012 Left upper quadrant pain: Secondary | ICD-10-CM

## 2024-07-27 DIAGNOSIS — R1013 Epigastric pain: Secondary | ICD-10-CM

## 2024-07-27 DIAGNOSIS — R1011 Right upper quadrant pain: Secondary | ICD-10-CM

## 2024-07-27 DIAGNOSIS — R109 Unspecified abdominal pain: Secondary | ICD-10-CM | POA: Diagnosis not present

## 2024-07-27 LAB — POCT URINE DIPSTICK
Bilirubin, UA: NEGATIVE
Glucose, UA: NEGATIVE mg/dL
Ketones, POC UA: NEGATIVE mg/dL
Leukocytes, UA: NEGATIVE
Nitrite, UA: NEGATIVE
Protein Ur, POC: NEGATIVE mg/dL
Spec Grav, UA: >=1.03 — AB (ref 1.010–1.025)
Urobilinogen, UA: 0.2 U/dL
pH, UA: 6 (ref 5.0–8.0)

## 2024-07-27 MED ORDER — ONDANSETRON 4 MG PO TBDP
4.0000 mg | ORAL_TABLET | Freq: Three times a day (TID) | ORAL | 0 refills | Status: AC | PRN
  Filled 2024-07-27: qty 20, 7d supply, fill #0

## 2024-07-27 NOTE — Discharge Instructions (Addendum)
 Persistent abdominal pain with nausea and diarrhea: Abdominal x-ray shows a moderate stool burden with abdominal gas but is otherwise normal.  Urinalysis was normal and no sign of infection.  Patient needs to get empty of stool and follow-up with primary care.  He may need H. pylori testing.  Provided ondansetron , 4 mg ODT, every 8 hours, if needed for nausea and vomiting  Provided handout on use of magnesium citrate to get empty of stool.  See below for more information.  Follow-up here as needed.  Magnesium Citrate Bowel Cleanse Night Before Bowel Cleanse:  Chill a Bottle of Magnesium Citrate overnight. Day of the Bowel Cleanse:  At 7:00 am, drink half the bottle of Magnesium Citrate. At 7:00 am, drink 8-12 ounces of a clear liquid. At 8:00 am, drink 8-12 ounces of a clear liquid. At 9:00 am, drink 8-12 ounces of a clear liquid. At 10:00 am, drink 8-12 ounces of a clear liquid. At 11:00 am, drink 8-12 ounces of a clear liquid. At 12:00 pm, drink the other half the bottle of Magnesium Citrate. At 12:00 pm, drink 8-12 ounces of a clear liquid. At 1:00 pm, drink 8-12 ounces of a clear liquid. At 2:00 pm, drink 8-12 ounces of a clear liquid. At 3:00 pm, drink 8-12 ounces of a clear liquid. At 4:00 pm, drink 8-12 ounces of a clear liquid. At 5:00 pm, drink 8-12 ounces of a clear liquid. Day after the Bowel Cleanse:  The next day, take Miralax, 1 capful mixed in 8 ounces of liquids, twice (morning and afternoon).  Drink lots of liquids all day long.  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 El anlisis de comoros fue normal y no present signos de infeccin. El paciente necesita evacuar las heces y Education officer, environmental un seguimiento con atencin primaria. Podra requerir una prueba de H. pylori. Se le administr ondansetrn, 4 mg ODT, cada 8  horas, si es necesario para las nuseas y los vmitos.  Se proporcion folleto sobre el uso de citrato de magnesio para Producer, television/film/video las heces. Consulte ms informacin a continuacin.  Realice el seguimiento aqu segn sea necesario.  Limpieza intestinal con citrato de magnesio La noche anterior a la limpieza intestinal: Enfre una botella de citrato de magnesio durante la noche. El da de la limpieza intestinal: A las 7:00 a. m., beba la mitad de la botella de citrato de [place]. A las 7:00 a. m., beba de 225 a 350 ml de un lquido claro. A las 8:00 a. m., beba de 225 a 350 ml de un lquido claro. A las 9:00 a. m., beba de 225 a 350 ml de un lquido claro. A las 10:00 a. m., beba de 225 a 350 ml de un lquido claro. A las 11:00 a. m., beba de 225 a 350 ml de un lquido claro. A las 12:00 p. m., beba la otra mitad de la botella de citrato de magnesio. A las 12:00 p. m., beba de 225 a 350 ml de un lquido claro. A la 1:00 p. m., beba de 225 a 350 ml de un lquido claro. A las 2:00 p. m., beba de 225 a 350 ml de un lquido claro. A las 3:00 p. m., beba de 225 a 350 ml de un lquido claro. A las 4:00 p. m., beba de 225 a 350 ml de un lquido claro. A las 5:00 p.  m., beba de 225 a 350 ml de un lquido claro. Al da siguiente de la limpieza intestinal: Al da siguiente, tome Miralax, 1 tapn mezclado con 225 ml de lquido, dos veces (maana y tarde).  Beba mucho lquido durante todo Medical laboratory scientific officer.

## 2024-07-27 NOTE — ED Provider Notes (Addendum)
 [facility]    CSN: [id number] Arrival date & time: 07/27/24  1117      History   Chief Complaint Chief Complaint  Patient presents with   Nausea   Abdominal Pain    HPI William Salinas is a 43 y.o. male.   43 year old male here with his wife.  He has had abdominal pain, nausea and diarrhea since approximately 07/20/2024 or earlier.  He has pain in the right lower quadrant, right upper quadrant, left upper quadrant, left lower quadrant and epigastric area.  No suprapubic pain.  He reports intermittent diarrhea that is pretty much just liquid.  He is only vomited once and it was liquid only.  He feels nauseous every time he eats.  He has not had a good bowel movement in over a week.  He has had the watery diarrhea intermittently.  He has not taken anything for his symptoms.  He feels a lot of abdominal bloating or pressure.  He worries that he has H. pylori because he has had that before.  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  He tries to keep hydrated but it is not always easy.  The history is limited by a language barrier (Patient understands quite a bit of [place] but does not speak [place].). A language interpreter was used (Patient's wife is with him and she is providing translation services at his request.  He was not interested in the translation service.).  Abdominal Pain Associated symptoms: diarrhea, nausea and vomiting   Associated symptoms: no chest pain, no chills, no constipation, no cough, no dysuria, no fever, no hematuria and no sore throat     Past Medical History:  Diagnosis Date   Allergies    Chronic back pain    f/by pain specialist   GERD (gastroesophageal reflux disease)    Hypertension     Patient Active Problem List   Diagnosis Date Noted   Chronic bilateral back pain 06/17/2024   Urinary frequency 06/17/2024   Wellness examination 06/17/2024   Umbilical hernia without obstruction  and without gangrene 01/16/2021   Epididymal cyst 04/17/2020   Mixed hyperlipidemia 04/10/2020   Mass of left testicle 03/31/2020   Muscle weakness (generalized) 02/27/2020   Hypokalemia 02/27/2020   HTN (hypertension) 02/27/2020   Obesity (BMI 30-39.9) 02/27/2020   Dehydration 02/27/2020   Chronic bilateral low back pain without sciatica 10/12/2017   Benign essential HTN 10/12/2017   Viral warts 10/12/2017   Abnormal CBC 09/03/2017   Chronic diarrhea 09/03/2017   Fatigue 09/03/2017   Generalized abdominal pain 09/03/2017   Gastroesophageal reflux disease 03/28/2017    Past Surgical History:  Procedure Laterality Date   COLONOSCOPY N/A    circa 2022 in [facility] Medications    Prior to Admission medications   Medication Sig Start Date End Date Taking? Authorizing Provider  ondansetron  (ZOFRAN -ODT) 4 MG disintegrating tablet Take 1 tablet (4 mg total) by mouth every 8 (eight) hours as needed for nausea or vomiting. 07/27/24  Yes Ival Domino, FNP  albuterol  (VENTOLIN  HFA) 108 (90 Base) MCG/ACT inhaler Inhale 2 puffs into the lungs. 04/02/18   [provider]  Blood Pressure KIT 1 each by Does not apply route in the morning and at bedtime. 02/28/20   Adhikari, Amrit, MD  losartan  (COZAAR ) 50 MG tablet Take 1 tablet (50 mg total) by mouth daily. 06/16/24   Adah,  Traci A, FNP  mometasone (NASONEX) 50 MCG/ACT nasal spray Place 2 sprays into the nose. 03/23/20   [provider]  pantoprazole (PROTONIX) 40 MG tablet Take 40 mg by mouth. 06/08/20   [provider]    Family History Family History  Problem Relation Age of Onset   Hypertension Mother        Otherwise denied in first degree relatives.    Social History Social History   Tobacco Use   Smoking status: Never   Smokeless tobacco: Never  Vaping Use   Vaping status: Never Used  Substance Use Topics   Alcohol use: Never   Drug use: Never     Allergies   B-12  [cyanocobalamin]   Review of Systems Review of Systems  Constitutional:  Negative for chills and fever.  HENT:  Negative for ear pain and sore throat.   Eyes:  Negative for pain and visual disturbance.  Respiratory:  Negative for cough.   Cardiovascular:  Negative for chest pain and palpitations.  Gastrointestinal:  Positive for abdominal distention, abdominal pain, diarrhea, nausea and vomiting. Negative for constipation.  Genitourinary:  Positive for frequency. Negative for dysuria, hematuria and urgency.  Musculoskeletal:  Negative for arthralgias and back pain.  Skin:  Negative for color change and rash.  Neurological:  Negative for seizures and syncope.  All other systems reviewed and are negative.    Physical Exam Triage Vital Signs ED Triage Vitals  Encounter Vitals Group     BP 07/27/24 1139 (!) 161/106     Girls Systolic BP Percentile --      Girls Diastolic BP Percentile --      Boys Systolic BP Percentile --      Boys Diastolic BP Percentile --      Pulse Rate 07/27/24 1139 69     Resp 07/27/24 1139 20     Temp 07/27/24 1139 97.6 F (36.4 C)     Temp Source 07/27/24 1139 Oral     SpO2 07/27/24 1139 96 %     Weight --      Height --      Head Circumference --      Peak Flow --      Pain Score 07/27/24 1137 6     Pain Loc --      Pain Education --      Exclude from Growth Chart --    No data found.  Updated Vital Signs BP (!) 161/106 (BP Location: Right Arm)   Pulse 69   Temp 97.6 F (36.4 C) (Oral)   Resp 20   SpO2 96%   Visual Acuity Right Eye Distance:   Left Eye Distance:   Bilateral Distance:    Right Eye Near:   Left Eye Near:    Bilateral Near:     Physical Exam Vitals and nursing note reviewed.  Constitutional:      General: He is not in acute distress.    Appearance: He is well-developed. He is not ill-appearing or toxic-appearing.  HENT:     Head: Normocephalic and atraumatic.     Right Ear: Hearing and external ear normal. There  is impacted cerumen.     Left Ear: Hearing and external ear normal. There is impacted cerumen.     Ears:     Comments: Reassessment after ear lavage: Right ear completely clear after ear lavage.  Left ear with copious amount of wax removed with lavage.  But ear still completely blocked with hard wax  near the eardrum.    Nose: No congestion or rhinorrhea.     Right Sinus: No maxillary sinus tenderness or frontal sinus tenderness.     Left Sinus: No maxillary sinus tenderness or frontal sinus tenderness.     Mouth/Throat:     Lips: Pink.     Mouth: Mucous membranes are moist.     Pharynx: Uvula midline. No oropharyngeal exudate or posterior oropharyngeal erythema.     Tonsils: No tonsillar exudate.  Eyes:     Conjunctiva/sclera: Conjunctivae normal.     Pupils: Pupils are equal, round, and reactive to light.  Cardiovascular:     Rate and Rhythm: Normal rate and regular rhythm.     Heart sounds: S1 normal and S2 normal. No murmur heard. Pulmonary:     Effort: Pulmonary effort is normal. No respiratory distress.     Breath sounds: Normal breath sounds. No decreased breath sounds, wheezing, rhonchi or rales.  Abdominal:     General: Bowel sounds are normal.     Palpations: Abdomen is soft.     Tenderness: There is abdominal tenderness (Mild to moderate abdominal pain with upper quadrant worse than lower quadrant.) in the right upper quadrant, right lower quadrant, epigastric area, left upper quadrant and left lower quadrant. There is no right CVA tenderness, left CVA tenderness, guarding or rebound. Negative signs include Murphy's sign, Rovsing's sign and McBurney's sign.  Musculoskeletal:        General: No swelling.     Cervical back: Neck supple.  Lymphadenopathy:     Head:     Right side of head: No submental, submandibular, tonsillar, preauricular or posterior auricular adenopathy.     Left side of head: No submental, submandibular, tonsillar, preauricular or posterior auricular  adenopathy.     Cervical: No cervical adenopathy.     Right cervical: No superficial cervical adenopathy.    Left cervical: No superficial cervical adenopathy.  Skin:    General: Skin is warm and dry.     Capillary Refill: Capillary refill takes less than 2 seconds.     Findings: No rash.  Neurological:     Mental Status: He is alert and oriented to person, place, and time.  Psychiatric:        Mood and Affect: Mood normal.      UC Treatments / Results  Labs (all labs ordered are listed, but only abnormal results are displayed) Labs Reviewed  POCT URINE DIPSTICK - Abnormal; Notable for the following components:      Result Value   Spec Grav, UA >=1.030 (*)    Blood, UA trace-intact (*)    All other components within normal limits    EKG   Radiology DG Abd 1 View Result Date: 07/27/2024 CLINICAL DATA:  Abdominal pain with nausea and diarrhea for around 1 week. EXAM: ABDOMEN - 1 VIEW COMPARISON:  None Available. FINDINGS: Single supine view of the abdomen demonstrates a normal nonobstructive bowel gas pattern. No supine evidence of pneumoperitoneum. No suspicious abdominal calcifications or acute osseous findings. IMPRESSION: Unremarkable supine view of the abdomen. Electronically Signed   By: Elsie Perone M.D.   On: 07/27/2024 13:35    Procedures Procedures (including critical care time)  Medications Ordered in UC Medications - No data to display  Initial Impression / Assessment and Plan / UC Course  I have reviewed the triage vital signs and the nursing notes.  Pertinent labs & imaging results that were available during my care of the patient were reviewed  by me and considered in my medical decision making (see chart for details).  Plan of Care: Persistent abdominal pain with nausea and diarrhea: Abdominal x-ray is essentially normal but it shows a moderate stool burden and abdominal gas.  His urinalysis was normal and no signs of infection.  Encouraged to get empty  of stool.  Provided handout on how to use magnesium citrate to get empty of stool.  He may need to see his primary care as he is interested in possible H. pylori testing for possible ulcer.  Provided ondansetron  4 mg ODT, every 8 hours, if needed for nausea or vomiting.  Bilateral cerumen impaction: Ears were full of wax.  Ear lavage completely cleared right ear and although large amount of wax came out of left ear the left ear was still obstructed with wax.  Provided information on how to soften earwax and then encouraged to return here or go to primary care for additional ear lavage.  Follow-up if symptoms do not improve, worsen or new symptoms occur.  I reviewed the plan of care with the patient and/or the patient's guardian.  The patient and/or guardian had time to ask questions and acknowledged that the questions were answered.  I provided instruction on symptoms or reasons to return here or to go to an ER, if symptoms/condition did not improve, worsened or if new symptoms occurred.   I spent over 30 minutes on patient care, including face to face time and care planning/patient management.  Additional time was needed for translation and communication, ear lavage and I reviewed his abdominal x-ray on my own and discussed it with the patient prior to the radiology review and report. Final Clinical Impressions(s) / UC Diagnoses   Final diagnoses:  Urinary frequency  Diarrhea, unspecified type  Left lower quadrant abdominal pain  Left upper quadrant abdominal pain  Right lower quadrant abdominal pain  Right upper quadrant abdominal pain  Abdominal pain, epigastric  Nausea without vomiting  Bilateral impacted cerumen     Discharge Instructions      Persistent abdominal pain with nausea and diarrhea: Abdominal x-ray shows a moderate stool burden with abdominal gas but is otherwise normal.  Urinalysis was normal and no sign of infection.  Patient needs to get empty of stool and follow-up  with primary care.  He may need H. pylori testing.  Provided ondansetron , 4 mg ODT, every 8 hours, if needed for nausea and vomiting  Provided handout on use of magnesium citrate to get empty of stool.  See below for more information.  Follow-up here as needed.  Magnesium Citrate Bowel Cleanse Night Before Bowel Cleanse:  Chill a Bottle of Magnesium Citrate overnight. Day of the Bowel Cleanse:  At 7:00 am, drink half the bottle of Magnesium Citrate. At 7:00 am, drink 8-12 ounces of a clear liquid. At 8:00 am, drink 8-12 ounces of a clear liquid. At 9:00 am, drink 8-12 ounces of a clear liquid. At 10:00 am, drink 8-12 ounces of a clear liquid. At 11:00 am, drink 8-12 ounces of a clear liquid. At 12:00 pm, drink the other half the bottle of Magnesium Citrate. At 12:00 pm, drink 8-12 ounces of a clear liquid. At 1:00 pm, drink 8-12 ounces of a clear liquid. At 2:00 pm, drink 8-12 ounces of a clear liquid. At 3:00 pm, drink 8-12 ounces of a clear liquid. At 4:00 pm, drink 8-12 ounces of a clear liquid. At 5:00 pm, drink 8-12 ounces of a clear liquid. Day after the  Bowel Cleanse:  The next day, take Miralax, 1 capful mixed in 8 ounces of liquids, twice (morning and afternoon).  Drink lots of liquids all day long.  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 El anlisis de comoros fue normal y no present signos de infeccin. El paciente necesita evacuar las heces y Education officer, environmental un seguimiento con atencin primaria. Podra requerir una prueba de H. pylori. Se le administr ondansetrn, 4 mg ODT, cada 8 horas, si es necesario para las nuseas y los vmitos.  Se proporcion folleto sobre el uso de citrato de magnesio para Producer, television/film/video las heces. Consulte ms informacin a continuacin.  Realice el seguimiento aqu segn sea necesario.  Limpieza  intestinal con citrato de magnesio La noche anterior a la limpieza intestinal: Enfre una botella de citrato de magnesio durante la noche. El da de la limpieza intestinal: A las 7:00 a. m., beba la mitad de la botella de citrato de [place]. A las 7:00 a. m., beba de 225 a 350 ml de un lquido claro. A las 8:00 a. m., beba de 225 a 350 ml de un lquido claro. A las 9:00 a. m., beba de 225 a 350 ml de un lquido claro. A las 10:00 a. m., beba de 225 a 350 ml de un lquido claro. A las 11:00 a. m., beba de 225 a 350 ml de un lquido claro. A las 12:00 p. m., beba la otra mitad de la botella de citrato de magnesio. A las 12:00 p. m., beba de 225 a 350 ml de un lquido claro. A la 1:00 p. m., beba de 225 a 350 ml de un lquido claro. A las 2:00 p. m., beba de 225 a 350 ml de un lquido claro. A las 3:00 p. m., beba de 225 a 350 ml de un lquido claro. A las 4:00 p. m., beba de 225 a 350 ml de un lquido claro. A las 5:00 p. m., beba de 225 a 350 ml de un lquido claro. Al da siguiente de la limpieza intestinal: Al da siguiente, tome Miralax, 1 tapn mezclado con 225 ml de lquido, dos veces (maana y tarde).  Beba mucho lquido durante todo Medical laboratory scientific officer.     ED Prescriptions     Medication Sig Dispense Auth. Provider   ondansetron  (ZOFRAN -ODT) 4 MG disintegrating tablet Take 1 tablet (4 mg total) by mouth every 8 (eight) hours as needed for nausea or vomiting. 20 tablet Shirlene Andaya, FNP      PDMP not reviewed this encounter.   Ival Domino, FNP 07/27/24 2023    Ival Domino, FNP 07/27/24 2025

## 2024-07-27 NOTE — ED Triage Notes (Signed)
 Pts wife states the pt has been experiencing abdominal pain, nausea, and diarrhea for around 1 week. Pain is located in all 4 quadrants and pt states he feels very bloated. He did have one episode of vomiting- states there was no food just liquid. The diarrhea is intermitted and states he has pain and pressure when having a bowl movement. Pt has not taken anything for his symptoms. Pt states that every time he eats anything it makes him feel nauseous and have diarrhea. Abdominal pain feels like a tight pressure.

## 2024-08-20 ENCOUNTER — Ambulatory Visit (HOSPITAL_BASED_OUTPATIENT_CLINIC_OR_DEPARTMENT_OTHER): Admitting: Student

## 2024-09-17 ENCOUNTER — Ambulatory Visit (HOSPITAL_BASED_OUTPATIENT_CLINIC_OR_DEPARTMENT_OTHER): Admitting: Family Medicine

## 2024-12-23 ENCOUNTER — Encounter (HOSPITAL_BASED_OUTPATIENT_CLINIC_OR_DEPARTMENT_OTHER): Payer: Self-pay

## 2024-12-23 ENCOUNTER — Other Ambulatory Visit (HOSPITAL_BASED_OUTPATIENT_CLINIC_OR_DEPARTMENT_OTHER): Payer: Self-pay

## 2024-12-23 ENCOUNTER — Encounter (HOSPITAL_BASED_OUTPATIENT_CLINIC_OR_DEPARTMENT_OTHER): Payer: Self-pay | Admitting: Student

## 2024-12-23 ENCOUNTER — Ambulatory Visit (INDEPENDENT_AMBULATORY_CARE_PROVIDER_SITE_OTHER): Admitting: Student

## 2024-12-23 VITALS — BP 159/94 | HR 72 | Temp 98.3°F | Resp 16 | Ht 66.93 in | Wt 190.9 lb

## 2024-12-23 DIAGNOSIS — I10 Essential (primary) hypertension: Secondary | ICD-10-CM

## 2024-12-23 DIAGNOSIS — J101 Influenza due to other identified influenza virus with other respiratory manifestations: Secondary | ICD-10-CM

## 2024-12-23 DIAGNOSIS — K219 Gastro-esophageal reflux disease without esophagitis: Secondary | ICD-10-CM

## 2024-12-23 DIAGNOSIS — J029 Acute pharyngitis, unspecified: Secondary | ICD-10-CM

## 2024-12-23 LAB — POC SOFIA 2 FLU + SARS ANTIGEN FIA
Influenza A, POC: POSITIVE — AB
Influenza B, POC: NEGATIVE
SARS Coronavirus 2 Ag: NEGATIVE

## 2024-12-23 LAB — POCT RAPID STREP A (OFFICE): Rapid Strep A Screen: NEGATIVE

## 2024-12-23 MED ORDER — AMLODIPINE BESYLATE 2.5 MG PO TABS
2.5000 mg | ORAL_TABLET | Freq: Every day | ORAL | 3 refills | Status: AC
  Filled 2024-12-23: qty 30, 30d supply, fill #0

## 2024-12-23 MED ORDER — PANTOPRAZOLE SODIUM 40 MG PO TBEC
40.0000 mg | DELAYED_RELEASE_TABLET | Freq: Every day | ORAL | 3 refills | Status: AC
  Filled 2024-12-23: qty 30, 30d supply, fill #0

## 2024-12-23 MED ORDER — LOSARTAN POTASSIUM 50 MG PO TABS
50.0000 mg | ORAL_TABLET | Freq: Every day | ORAL | 1 refills | Status: AC
  Filled 2024-12-23: qty 30, 30d supply, fill #0

## 2024-12-23 MED ORDER — OSELTAMIVIR PHOSPHATE 75 MG PO CAPS
75.0000 mg | ORAL_CAPSULE | Freq: Two times a day (BID) | ORAL | 0 refills | Status: AC
  Filled 2024-12-23: qty 10, 5d supply, fill #0

## 2024-12-23 NOTE — Patient Instructions (Signed)
 It was nice to see you today!  If you have any problems before your next visit feel free to message me via MyChart (minor issues or questions) or call the office, otherwise you may reach out to schedule an office visit.  Thank you! Pau Banh, PA-C

## 2024-12-23 NOTE — Progress Notes (Signed)
 "  Established Patient Office Visit  Subjective   Patient ID: William Salinas, male    DOB: 1981/04/14  Age: 44 y.o. MRN: [id number]  Chief Complaint  Patient presents with   Generalized Body Aches    Has body aches, headache, dry cough, sneezing, sore throat, and congestion. Also having some phlegm.Thinks they had a fever last night. Was really red and hot.   Medication Refill    Was told to get a higher dose of BP med if BP stays high. Usually about the same as it is today in the clinic and higher.    Translator was denied by family today.  Discussed the use of AI scribe software for clinical note transcription with the patient, who gave verbal consent to proceed.  History of Present Illness   William Salinas is a 44 year old male with hypertension who presents with flu symptoms. He is accompanied by his wife.  He has been experiencing flu symptoms since yesterday and has been diagnosed with influenza A. He feels achy and has neck pain. He is concerned about the duration of the illness and its impact on his ability to work, as he works in a paramedic and interacts with customers.  He has a history of hypertension and is currently taking losartan  50 mg. Despite this medication, his blood pressure has remained high. Previously, he was on a combination of losartan  and amlodipine , which he found effective. He is concerned about potential side effects such as dizziness with medication adjustments and has been monitoring his blood pressure at home, which has remained elevated.  He inquires about the safety of taking Excedrin with his high blood pressure, noting that he does not take it daily but occasionally.  No fever or shortness of breath.      Patient Active Problem List   Diagnosis Date Noted   Chronic bilateral back pain 06/17/2024   Urinary frequency 06/17/2024   Wellness examination 06/17/2024   Umbilical hernia without obstruction and without  gangrene 01/16/2021   Epididymal cyst 04/17/2020   Mixed hyperlipidemia 04/10/2020   Mass of left testicle 03/31/2020   Muscle weakness (generalized) 02/27/2020   Hypokalemia 02/27/2020   HTN (hypertension) 02/27/2020   Obesity (BMI 30-39.9) 02/27/2020   Dehydration 02/27/2020   Chronic bilateral low back pain without sciatica 10/12/2017   Benign essential HTN 10/12/2017   Viral warts 10/12/2017   Abnormal CBC 09/03/2017   Chronic diarrhea 09/03/2017   Fatigue 09/03/2017   Generalized abdominal pain 09/03/2017   Gastroesophageal reflux disease 03/28/2017   Past Medical History:  Diagnosis Date   Allergies    Chronic back pain    f/by pain specialist   GERD (gastroesophageal reflux disease)    Hypertension    Social History[1] Allergies[2]    ROS Per HPI.    Objective:     BP (!) 148/90   Pulse 72   Temp 98.3 F (36.8 C) (Oral)   Resp 16   Ht 5' 6.93 (1.7 m)   Wt 190 lb 14.4 oz (86.6 kg)   SpO2 96%   BMI 29.96 kg/m  BP Readings from Last 3 Encounters:  12/23/24 (!) 148/90  07/27/24 (!) 161/106  06/17/24 130/89   Wt Readings from Last 3 Encounters:  12/23/24 190 lb 14.4 oz (86.6 kg)  06/17/24 187 lb 3.7 oz (84.9 kg)  11/26/22 198 lb (89.8 kg)   SpO2 Readings from Last 3 Encounters:  12/23/24 96%  07/27/24 96%  06/17/24 95%  Physical Exam Constitutional:      General: He is not in acute distress.    Appearance: Normal appearance. He is ill-appearing. He is not toxic-appearing.  HENT:     Head: Normocephalic and atraumatic.     Right Ear: External ear normal.     Left Ear: External ear normal.     Nose: Nose normal.  Eyes:     Conjunctiva/sclera: Conjunctivae normal.  Cardiovascular:     Rate and Rhythm: Normal rate and regular rhythm.     Pulses: Normal pulses.     Heart sounds: Normal heart sounds. No murmur heard.    No friction rub.  Pulmonary:     Effort: Pulmonary effort is normal. No respiratory distress.     Breath sounds:  Normal breath sounds. No wheezing, rhonchi or rales.  Skin:    General: Skin is warm and dry.     Coloration: Skin is not jaundiced or pale.  Neurological:     Mental Status: He is alert.  Psychiatric:        Mood and Affect: Mood normal.        Behavior: Behavior normal.      Results for orders placed or performed in visit on 12/23/24  POC SOFIA 2 FLU + SARS ANTIGEN FIA  Result Value Ref Range   Influenza A, POC Positive (A) Negative   Influenza B, POC Negative Negative   SARS Coronavirus 2 Ag Negative Negative  POCT rapid strep A  Result Value Ref Range   Rapid Strep A Screen Negative Negative    Last CBC Lab Results  Component Value Date   WBC 8.2 06/17/2024   HGB 17.4 06/17/2024   HCT 52.4 (H) 06/17/2024   MCV 94 06/17/2024   MCH 31.4 06/17/2024   RDW 13.0 06/17/2024   PLT 213 06/17/2024   Last metabolic panel Lab Results  Component Value Date   GLUCOSE 94 06/17/2024   NA 142 06/17/2024   K 4.2 06/17/2024   CL 107 (H) 06/17/2024   CO2 19 (L) 06/17/2024   BUN 17 06/17/2024   CREATININE 1.14 06/17/2024   EGFR 83 06/17/2024   CALCIUM 10.0 06/17/2024   PHOS 1.9 (L) 02/27/2020   PROT 7.1 06/17/2024   ALBUMIN 4.4 06/17/2024   LABGLOB 2.7 06/17/2024   BILITOT 0.6 06/17/2024   ALKPHOS 78 06/17/2024   AST 31 06/17/2024   ALT 39 06/17/2024   ANIONGAP 11 04/24/2022   Last lipids Lab Results  Component Value Date   CHOL 208 (H) 06/17/2024   HDL 32 (L) 06/17/2024   LDLCALC 124 (H) 06/17/2024   TRIG 296 (H) 06/17/2024   CHOLHDL 6.5 (H) 06/17/2024   Last hemoglobin A1c Lab Results  Component Value Date   HGBA1C 5.4 06/16/2024      The 10-year ASCVD risk score (Arnett DK, et al., 2019) is: 3.9%    Assessment & Plan:   Assessment and Plan    Influenza A Acute Influenza A infection with symptoms starting yesterday. Symptoms include body aches and neck pain. No significant respiratory distress. Discussed the option of Tamiflu , which may reduce the  duration of symptoms by a day but is not highly effective. Tamiflu  may cause a metallic taste. Advised that flu symptoms typically resolve in about a week, with full recovery in two weeks. Discussed the difference between flu A and flu B, noting that flu B often presents with more gastrointestinal symptoms. - Prescribed Tamiflu  if desired. - Advised rest and increased fluid intake. -  Recommended ibuprofen or Tylenol  for body aches and chills. - Advised wearing a mask at work to prevent transmission. - Provided a note for work absence until Monday. - Advised follow-up with primary care for Tamiflu  prophylaxis if sleeping with spouse.  Primary hypertension Chronic, not at goal. Hypertension not adequately controlled on current regimen of losartan  50 mg. Previous regimen included amlodipine  and hydrochlorothiazide, which was effective. Discussed the benefits of using two lower-dose medications for better control and fewer side effects. Advised that Excedrin may slightly increase blood pressure but is not a major concern at current levels. - Prescribed amlodipine  2.5 mg daily. - Scheduled follow-up in six weeks for blood pressure recheck. - Advised against daily use of Excedrin.   GERD - Refill protonix       Orders Placed This Encounter  Procedures   POC SOFIA 2 FLU + SARS ANTIGEN FIA   POCT rapid strep A    Return in about 3 months (around 03/23/2025), or if symptoms worsen or fail to improve, for Chronic Followup.    Jessica Seidman T Aleicia Kenagy, PA-C     [1]  Social History Tobacco Use   Smoking status: Never    Passive exposure: Never   Smokeless tobacco: Never  Vaping Use   Vaping status: Never Used  Substance Use Topics   Alcohol use: Never   Drug use: Never  [2]  Allergies Allergen Reactions   B-12 [Cyanocobalamin]     DUE TO LOW K+, DROPPED K+    "

## 2025-02-04 ENCOUNTER — Ambulatory Visit (HOSPITAL_BASED_OUTPATIENT_CLINIC_OR_DEPARTMENT_OTHER): Payer: Self-pay

## 2025-03-25 ENCOUNTER — Ambulatory Visit (HOSPITAL_BASED_OUTPATIENT_CLINIC_OR_DEPARTMENT_OTHER): Payer: Self-pay | Admitting: Student
